# Patient Record
Sex: Female | Born: 1960 | Race: Black or African American | Hispanic: No | Marital: Married | Smoking: Never smoker
Health system: Southern US, Community
[De-identification: ages and names within clinical notes are randomized; demographics above are authoritative.]

## PROBLEM LIST (undated history)

## (undated) DIAGNOSIS — D219 Benign neoplasm of connective and other soft tissue, unspecified: Secondary | ICD-10-CM

## (undated) DIAGNOSIS — R7303 Prediabetes: Secondary | ICD-10-CM

## (undated) DIAGNOSIS — M5430 Sciatica, unspecified side: Secondary | ICD-10-CM

## (undated) DIAGNOSIS — E669 Obesity, unspecified: Secondary | ICD-10-CM

## (undated) DIAGNOSIS — M2550 Pain in unspecified joint: Secondary | ICD-10-CM

## (undated) DIAGNOSIS — E559 Vitamin D deficiency, unspecified: Secondary | ICD-10-CM

## (undated) DIAGNOSIS — K219 Gastro-esophageal reflux disease without esophagitis: Secondary | ICD-10-CM

## (undated) DIAGNOSIS — M25559 Pain in unspecified hip: Secondary | ICD-10-CM

## (undated) DIAGNOSIS — D649 Anemia, unspecified: Secondary | ICD-10-CM

## (undated) DIAGNOSIS — I10 Essential (primary) hypertension: Secondary | ICD-10-CM

## (undated) DIAGNOSIS — R600 Localized edema: Secondary | ICD-10-CM

## (undated) DIAGNOSIS — G4730 Sleep apnea, unspecified: Secondary | ICD-10-CM

## (undated) DIAGNOSIS — M549 Dorsalgia, unspecified: Secondary | ICD-10-CM

## (undated) HISTORY — DX: Obesity, unspecified: E66.9

## (undated) HISTORY — DX: Essential (primary) hypertension: I10

## (undated) HISTORY — DX: Localized edema: R60.0

## (undated) HISTORY — DX: Benign neoplasm of connective and other soft tissue, unspecified: D21.9

## (undated) HISTORY — DX: Vitamin D deficiency, unspecified: E55.9

## (undated) HISTORY — DX: Dorsalgia, unspecified: M54.9

## (undated) HISTORY — DX: Sciatica, unspecified side: M54.30

## (undated) HISTORY — PX: CARPAL TUNNEL RELEASE: SHX101

## (undated) HISTORY — PX: OTHER SURGICAL HISTORY: SHX169

## (undated) HISTORY — DX: Gastro-esophageal reflux disease without esophagitis: K21.9

## (undated) HISTORY — DX: Pain in unspecified joint: M25.50

## (undated) HISTORY — DX: Pain in unspecified hip: M25.559

## (undated) HISTORY — PX: MYOMECTOMY ABDOMINAL APPROACH: SUR870

## (undated) HISTORY — PX: MYOMECTOMY VAGINAL APPROACH: SUR871

## (undated) HISTORY — DX: Anemia, unspecified: D64.9

## (undated) HISTORY — DX: Prediabetes: R73.03

## (undated) HISTORY — PX: BREAST EXCISIONAL BIOPSY: SUR124

## (undated) HISTORY — DX: Sleep apnea, unspecified: G47.30

---

## 1998-12-14 ENCOUNTER — Other Ambulatory Visit: Admission: RE | Admit: 1998-12-14 | Discharge: 1998-12-14 | Payer: Self-pay | Admitting: Obstetrics and Gynecology

## 1999-01-15 ENCOUNTER — Inpatient Hospital Stay (HOSPITAL_COMMUNITY): Admission: AD | Admit: 1999-01-15 | Discharge: 1999-01-15 | Payer: Self-pay | Admitting: Obstetrics and Gynecology

## 1999-09-06 ENCOUNTER — Other Ambulatory Visit: Admission: RE | Admit: 1999-09-06 | Discharge: 1999-09-06 | Payer: Self-pay | Admitting: Urology

## 1999-11-20 ENCOUNTER — Encounter (INDEPENDENT_AMBULATORY_CARE_PROVIDER_SITE_OTHER): Payer: Self-pay | Admitting: Specialist

## 1999-11-20 ENCOUNTER — Ambulatory Visit (HOSPITAL_COMMUNITY): Admission: RE | Admit: 1999-11-20 | Discharge: 1999-11-20 | Payer: Self-pay | Admitting: Obstetrics and Gynecology

## 2000-03-06 ENCOUNTER — Encounter: Payer: Self-pay | Admitting: Obstetrics and Gynecology

## 2000-03-06 ENCOUNTER — Encounter: Admission: RE | Admit: 2000-03-06 | Discharge: 2000-03-06 | Payer: Self-pay | Admitting: Obstetrics and Gynecology

## 2000-11-07 ENCOUNTER — Other Ambulatory Visit: Admission: RE | Admit: 2000-11-07 | Discharge: 2000-11-07 | Payer: Self-pay | Admitting: Obstetrics and Gynecology

## 2001-04-08 ENCOUNTER — Encounter: Payer: Self-pay | Admitting: Obstetrics and Gynecology

## 2001-04-08 ENCOUNTER — Encounter: Admission: RE | Admit: 2001-04-08 | Discharge: 2001-04-08 | Payer: Self-pay | Admitting: Obstetrics and Gynecology

## 2001-04-10 ENCOUNTER — Encounter: Payer: Self-pay | Admitting: Obstetrics and Gynecology

## 2001-04-10 ENCOUNTER — Encounter: Admission: RE | Admit: 2001-04-10 | Discharge: 2001-04-10 | Payer: Self-pay | Admitting: Obstetrics and Gynecology

## 2001-04-14 ENCOUNTER — Encounter: Admission: RE | Admit: 2001-04-14 | Discharge: 2001-04-14 | Payer: Self-pay | Admitting: Obstetrics and Gynecology

## 2001-04-14 ENCOUNTER — Encounter: Payer: Self-pay | Admitting: Obstetrics and Gynecology

## 2001-08-05 DIAGNOSIS — D249 Benign neoplasm of unspecified breast: Secondary | ICD-10-CM | POA: Insufficient documentation

## 2001-09-30 ENCOUNTER — Other Ambulatory Visit: Admission: RE | Admit: 2001-09-30 | Discharge: 2001-09-30 | Payer: Self-pay | Admitting: Obstetrics and Gynecology

## 2001-10-07 ENCOUNTER — Encounter: Payer: Self-pay | Admitting: Obstetrics and Gynecology

## 2001-10-07 ENCOUNTER — Encounter: Admission: RE | Admit: 2001-10-07 | Discharge: 2001-10-07 | Payer: Self-pay | Admitting: Obstetrics and Gynecology

## 2002-10-26 ENCOUNTER — Other Ambulatory Visit: Admission: RE | Admit: 2002-10-26 | Discharge: 2002-10-26 | Payer: Self-pay | Admitting: Obstetrics and Gynecology

## 2002-10-28 ENCOUNTER — Encounter: Admission: RE | Admit: 2002-10-28 | Discharge: 2002-10-28 | Payer: Self-pay | Admitting: Obstetrics and Gynecology

## 2002-10-28 ENCOUNTER — Encounter: Payer: Self-pay | Admitting: Obstetrics and Gynecology

## 2004-01-03 ENCOUNTER — Encounter: Admission: RE | Admit: 2004-01-03 | Discharge: 2004-01-03 | Payer: Self-pay | Admitting: Obstetrics and Gynecology

## 2005-04-02 ENCOUNTER — Encounter: Admission: RE | Admit: 2005-04-02 | Discharge: 2005-04-02 | Payer: Self-pay | Admitting: Obstetrics and Gynecology

## 2005-04-15 ENCOUNTER — Encounter: Admission: RE | Admit: 2005-04-15 | Discharge: 2005-04-15 | Payer: Self-pay | Admitting: Obstetrics and Gynecology

## 2005-06-03 ENCOUNTER — Encounter: Admission: RE | Admit: 2005-06-03 | Discharge: 2005-06-03 | Payer: Self-pay | Admitting: General Surgery

## 2005-06-03 ENCOUNTER — Ambulatory Visit (HOSPITAL_COMMUNITY): Admission: RE | Admit: 2005-06-03 | Discharge: 2005-06-03 | Payer: Self-pay | Admitting: General Surgery

## 2005-06-03 ENCOUNTER — Encounter (INDEPENDENT_AMBULATORY_CARE_PROVIDER_SITE_OTHER): Payer: Self-pay | Admitting: *Deleted

## 2005-06-03 ENCOUNTER — Ambulatory Visit (HOSPITAL_BASED_OUTPATIENT_CLINIC_OR_DEPARTMENT_OTHER): Admission: RE | Admit: 2005-06-03 | Discharge: 2005-06-03 | Payer: Self-pay | Admitting: General Surgery

## 2005-07-04 ENCOUNTER — Other Ambulatory Visit: Admission: RE | Admit: 2005-07-04 | Discharge: 2005-07-04 | Payer: Self-pay | Admitting: Gynecology

## 2006-04-08 ENCOUNTER — Encounter: Admission: RE | Admit: 2006-04-08 | Discharge: 2006-04-08 | Payer: Self-pay | Admitting: Gynecology

## 2008-10-10 ENCOUNTER — Encounter: Admission: RE | Admit: 2008-10-10 | Discharge: 2008-10-10 | Payer: Self-pay | Admitting: Obstetrics and Gynecology

## 2009-07-12 ENCOUNTER — Encounter: Admission: RE | Admit: 2009-07-12 | Discharge: 2009-08-02 | Payer: Self-pay | Admitting: Internal Medicine

## 2009-10-27 ENCOUNTER — Encounter: Admission: RE | Admit: 2009-10-27 | Discharge: 2009-10-27 | Payer: Self-pay | Admitting: Obstetrics and Gynecology

## 2010-08-05 DIAGNOSIS — K635 Polyp of colon: Secondary | ICD-10-CM | POA: Insufficient documentation

## 2010-12-21 NOTE — H&P (Signed)
[place]  Patient:    Whitney Foster, Whitney Foster                      MRN: [id number] Attending:  Maris Berger. Pennie Rushing, M.D.                         History and Physical  DATE OF BIRTH:  2061-07-21.  HISTORY OF PRESENT ILLNESS:  The patient is a 50 year old married black female,  PI/0/0/I, who presents for evaluation of back pain with a known diagnosis of uterine fibroids.  The patient had her last menstrual period on 09/24/99.  Menses have been irregular, though no intermenstrual bleeding.  She is currently not using any contraception. Review of the patients history reveals that she underwent an abdominal myomectomy in 1994 and has had recurrence of her symptoms.  She subsequently underwent a hysteroscopic myomectomy in 1997, and had a pregnancy delivered by cesarean section in 1998.  She presented on 10/18/99 for an opinion concerning management and evaluation of back pain, which had been present since November.  Her work-up to  that date had included back x-rays by her primary care physician, which were within normal limits, and a complete urologic work-up, which was found to be within normal limits.  That included an intravenous pyelogram.  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  At that time, the recommendation was made that she undergo a hysterectomy and she presents for further suggestions concerning her management.  PAST HISTORY:  Menarche at age 66, with fairly regular monthly menses up until November, when her menses became somewhat irregular.  She denies any menopausal  symptoms.  She would describe her menses as heavy flow, usually lasting seven to ten days.  She denies any intermenstrual bleeding, and is currently using no contraception, though she does desire  permanent surgical sterilization.  She has used birth control pills in the past.  She is currently sexually active in a monogamous relationship.  Her last Pap smear was May, 2000, and was normal. She has no history of abnormal Pap smears.  She has a diagnosis of fibroids, which ave been present for approximately ten years.  Her last mammogram was in June, 2000, and was normal.  Medical history is essentially negative.  SURGICAL HISTORY:  Hysteroscopic myomectomy.  Cesarean section.  Abdominal myomectomy.  Removal of vocal cord nodules.  CURRENT MEDICATIONS:  None.  DRUG ALLERGIES:  None.   FAMILY HISTORY:  Positive for heart disease, varicose veins, thyroid disease, cancer, hypertension, diabetes and stroke.  REVIEW OF SYSTEMS:  Positive for back pain, which is present on an almost daily  basis.  She has no associated leg numbness or inability to walk.  She does use ibuprofen with some improvement in her pain.  She has worsening of back pain with her menses.  PHYSICAL EXAMINATION:  VITAL SIGNS:  Blood pressure 110/88.  LUNGS:  Clear.  HEART:  Regular rate and rhythm.  ABDOMEN:  Soft, without masses or organomegaly.  PELVIC:  ______ within normal limits.  Vagina is ______.  Cervix without gross lesions.  The uterus feels upper limits of normal size and anterior, mobile; there is tenderness to mobility of the uterus with no posterior component to the uterus and no posterior pain can  be elicited on examination or on rectovaginal exam.  IMPRESSION: 1. Known uterine fibroids. 2. Back pain of recent onset; question relationship to uterine fibroids.  DISPOSITION:  A long discussion has been held with the patient concerning options for further evaluation of her pain.  She is offered diagnostic laparoscopy to determine whether other pelvic etiology, such as adenomyosis or endometriosis might be contributing to her back pain, or whether no other gynecologic diagnoses  can be found.  She wishes to undergo diagnostic laparoscopy and at the same time, surgical sterilization.  She understands the permanence of surgical sterilization as well as the risks of subsequent pregnancy after sterilization.  She likewise understands the risks of anesthesia, bleeding, infection and damage to adjacent organs, but wishes to proceed with diagnostic laparoscopy and laparoscopic tubal cautery at [facility] on 11/20/99. DD:  11/19/99 TD:  11/19/99 Job: [id number]

## 2010-12-21 NOTE — Op Note (Signed)
[facility]  Patient:    Whitney Foster, Whitney Foster                    MRN: [id number] Proc. Date: 11/20/99 Adm. Date:  [id number] Attending:  Dierdre Forth Pearline                           Operative Report  PREOPERATIVE DIAGNOSIS:       Uterine fibroids, increasingly severe back pain.  POSTOPERATIVE DIAGNOSIS:      Uterine fibroids, rule out adenomyosis, rule out endometriosis.  OPERATION:                    Diagnostic laparoscopy, biopsies of the left posterior cul-de-sac and the left anterior uterus.  SURGEON:                      Vanessa P. Haygood, M.D.  ASSISTANT:  FINDINGS:                     The uterus was upper limits of normal size with an apparently mushy consistency.  There were several small uterine fibroids, the largest of which measured 2 x 2 cm and was on the posterior left uterus near the cervix.  There was a smaller fibroid on the right posterior uterus near the right uterine artery and an anterior fundal fibroid.  There was an overall apparently  soft consistency to the uterus as noted with manipulation with the blunt probe.  Anteriorly, there was evidence of a previous cesarean section with minimal scarring on the anterior cervix and the anterior peritoneum.  The ovaries were normal bilaterally.  The tubes were normal bilaterally.  There was a 2 cm portion of omentum adherent to the right cornual region with no attachment to other omental segment.  The appendix appeared normal as did the gallbladder and liver edge.  ANESTHESIA:  ESTIMATED BLOOD LOSS:  DESCRIPTION OF PROCEDURE:     After appropriate identification, a long discussion was held with the patient and her husband concerning an initially expressed desire for tubal sterilization.  After that 15-minute discussion, it was clear that the couple had not yet made up their minds for surgical sterilization and in fact had not clearly made the decision that they  wanted no further children.  It was then decided that the tubal cautery would be omitted from the operative procedure.  The patient was taken to the operating room and placed on the operating table.  After the attainment of adequate general anesthesia, she was placed in the modified lithotomy position.  The abdomen, perineum, and vagina were prepped with multiple layers of Betadine and a Foley catheter inserted into the bladder under sterile  conditions and connected to straight drainage.  A single tooth tenaculum was placed on the anterior cervix.  The abdomen was draped as a sterile field. Subumbilical infiltration of 0.25% Marcaine for a total of 4 cc and then a total of 3 cc to he right and left of midline in the suprapubic region was undertaken.  A subumbilical incisions was made and the Veress cannula placed through that incision into the  peritoneal cavity.  A pneumoperitoneum was created with 3.2 liters of CO2.  The  Veress cannula was removed and the laparoscopic trocar placed through that incision into the peritoneal cavity.  A laparoscope was placed through the trocar sleeve and a left suprapubic incision made  and the laparoscopic probe trocar placed through that incision under direct visualization.  The above noted findings were made in addition to an area of peritoneal defect underneath the left uterosacral ligament. This area was biopsied to rule out endometriosis.  A biopsy of the anterior left uterus was then obtained to rule out adenomyosis and hemostasis achieved with bipolar cautery.  Copious irrigation was carried out and hemostasis noted to be  adequate.  Approximately 60 cc of warm Ringers lactate was left in the peritoneal cavity.  All instruments were removed from the peritoneal cavity under direct visualization as the CO2 was allowed to escape.  The subumbilical incision and suprapubic incision were closed with subcuticular sutures of 3-0 Vicryl.   A sterile dressing was applied to the subumbilical incision and Steri-Strips appeared to he suprapubic incision.  The single tooth tenaculum and Foley catheter were removed and the patient was awakened from general anesthesia and taken to the recovery oom in satisfactory condition having tolerated the procedure well with sponge, needle, and instrument counts were correct. DD:  11/20/99 TD:  11/20/99 Job: [id number]

## 2010-12-21 NOTE — Op Note (Signed)
Whitney Foster, Whitney Foster             ACCOUNT NO.:  [id number]   MEDICAL RECORD NO.:  [id number]          PATIENT TYPE:  AMB   LOCATION:  DSC                          FACILITY:  [facility]   PHYSICIAN:  Adolph Pollack, M.D.DATE OF BIRTH:  [birth year]   DATE OF PROCEDURE:  06/03/2005  DATE OF DISCHARGE:                                 OPERATIVE REPORT   PREOP DIAGNOSIS:  Nonpalpable left breast lesion.   POSTOPERATIVE DIAGNOSIS:  Nonpalpable left breast lesion.   PROCEDURE:  Left breast biopsy after needle localization.   SURGEON:  Adolph Pollack, M.D.   ANESTHESIA:  General plus plain Marcaine local.   INDICATIONS:  This 50 year old female has had a nonpalpable left breast mass  that had been stable; however, it did change in size this year and biopsy  was recommended. She had a successful needle localization and now presents  for the above procedure. We have discussed the procedure and the risks  preoperatively.   She is seen in the holding area and the left breast marked with my initials.  She then brought to the operating room, placed supine on the operating  table. General anesthetic was induced. Tape was removed from the wire and  part of the wire was cut off. The left breast and remaining wire were  sterilely prepped and draped. Local anesthetic was infiltrated in the  lateral aspect of the left breast superficially and deep and a curvilinear  incision was made in the left breast with a small skin plug excised around  the wire. I then used sharp dissection and excised a cylinder shaped sample  of breast tissue all around the wire and removed this. I could see the tip  of the wire after the excision. I then excised more tissue medially to be a  final medial margin. The curved end of the wire anteriorly was marked with a  silk suture. The specimen mammography was performed and the area was  contained in the specimen.   Following this, the wound was inspected and hemostasis  was obtained using  electrocautery. Once hemostasis was adequate, the subcutaneous tissue was  approximated with interrupted 3-0 Vicryl sutures and skin closed with a  running 4-0 Monocryl subcuticular stitch. Steri-Strips and sterile dressing  were applied.   She tolerated the procedure well without apparent complications and was  taken to the recovery room in satisfactory condition. She will be given  discharge instructions and Vicodin for pain and follow-up in the office with  me in a week or two.      Adolph Pollack, M.D.  Electronically Signed     TJR/MEDQ  D:  06/03/2005  T:  06/03/2005  Job:  [id number]   cc:   Daryl Eastern, M.D.  Fax: [phone]   Candyce Churn. Allyne Gee, M.D.  Fax: [phone]

## 2011-08-22 ENCOUNTER — Encounter: Payer: Self-pay | Admitting: Cardiology

## 2011-10-18 ENCOUNTER — Other Ambulatory Visit: Payer: Self-pay | Admitting: Obstetrics and Gynecology

## 2011-10-18 ENCOUNTER — Other Ambulatory Visit: Payer: Self-pay | Admitting: Internal Medicine

## 2011-10-18 DIAGNOSIS — Z1231 Encounter for screening mammogram for malignant neoplasm of breast: Secondary | ICD-10-CM

## 2011-10-28 ENCOUNTER — Ambulatory Visit: Payer: Self-pay

## 2011-10-28 ENCOUNTER — Ambulatory Visit
Admission: RE | Admit: 2011-10-28 | Discharge: 2011-10-28 | Disposition: A | Discharge status: Home / Self Care | Payer: 59 | Source: Ambulatory Visit | Attending: Internal Medicine | Admitting: Internal Medicine

## 2011-10-28 DIAGNOSIS — Z1231 Encounter for screening mammogram for malignant neoplasm of breast: Secondary | ICD-10-CM

## 2011-10-31 ENCOUNTER — Other Ambulatory Visit: Payer: Self-pay | Admitting: Internal Medicine

## 2011-10-31 DIAGNOSIS — R928 Other abnormal and inconclusive findings on diagnostic imaging of breast: Secondary | ICD-10-CM

## 2011-11-01 ENCOUNTER — Ambulatory Visit
Admission: RE | Admit: 2011-11-01 | Discharge: 2011-11-01 | Disposition: A | Discharge status: Home / Self Care | Payer: 59 | Source: Ambulatory Visit | Attending: Internal Medicine | Admitting: Internal Medicine

## 2011-11-01 ENCOUNTER — Ambulatory Visit: Payer: Self-pay

## 2011-11-01 DIAGNOSIS — R928 Other abnormal and inconclusive findings on diagnostic imaging of breast: Secondary | ICD-10-CM

## 2011-11-13 ENCOUNTER — Encounter: Payer: Self-pay | Admitting: Obstetrics and Gynecology

## 2011-11-13 ENCOUNTER — Ambulatory Visit (INDEPENDENT_AMBULATORY_CARE_PROVIDER_SITE_OTHER): Payer: 59 | Admitting: Obstetrics and Gynecology

## 2011-11-13 DIAGNOSIS — D259 Leiomyoma of uterus, unspecified: Secondary | ICD-10-CM

## 2011-11-13 DIAGNOSIS — N949 Unspecified condition associated with female genital organs and menstrual cycle: Secondary | ICD-10-CM

## 2011-11-13 DIAGNOSIS — R109 Unspecified abdominal pain: Secondary | ICD-10-CM | POA: Insufficient documentation

## 2011-11-13 DIAGNOSIS — R102 Pelvic and perineal pain: Secondary | ICD-10-CM

## 2011-11-13 DIAGNOSIS — D219 Benign neoplasm of connective and other soft tissue, unspecified: Secondary | ICD-10-CM

## 2011-11-13 LAB — POCT URINALYSIS DIPSTICK
Ketones, UA: NEGATIVE
Leukocytes, UA: NEGATIVE
Nitrite, UA: NEGATIVE
Urobilinogen, UA: NEGATIVE

## 2011-11-15 LAB — URINE CULTURE: Colony Count: 8000

## 2011-11-17 ENCOUNTER — Encounter: Payer: Self-pay | Admitting: Obstetrics and Gynecology

## 2011-11-17 NOTE — Progress Notes (Signed)
Subjective: Patient is a 51 y.o. gravida 2 para 1, female.  Presents for evaluation of abdominal/pelvic pain. Onset of symptoms was gradual starting over a year ago with gradually worsening course since that time. The pain occurs all throughout the month. It is located in the RLQ and radiates into the lower pelvis and lasts a few hours. She describes the pain as dull, aching and intermittent. Symptoms improve with time. In the past, she has undergone medical management, including NSAIDS.  She has no history of PID, STD's. She does not desire further childbearing.  Pertinent Gyn History: [place] fibroids Menses: flow is moderate, regular every month without intermenstrual spotting and usually lasting 2 to 3 days Bleeding: none outside menses Contraception: none DES exposure: unknown Blood transfusions: none STDs: no past history Preventive screening:  Last mammogram: normal Date: 2011 Last pap: normal Date: 2010  Patient Active Problem List  Diagnoses Date Noted  . Fibroid 11/13/2011  . Abdominal pain 11/13/2011  . Pelvic pain 11/13/2011   Past Medical History  Diagnosis Date  . Hypertension   . Anemia   . Fibroids     "2000"    Past Surgical History  Procedure Date  . Cesarean section     "1998"  . Myomectomy abdominal approach     1994  . Myomectomy vaginal approach     1997     (Not in a hospital admission) No Known Allergies  History  Substance Use Topics  . Smoking status: Never Smoker   . Smokeless tobacco: Not on file  . Alcohol Use: No    Family History  Problem Relation Age of Onset  . Heart disease Mother 72  . Heart disease Brother   . Heart disease Maternal Aunt     Review of Systems Gastrointestinal: positive for abdominal pain, negative for change in bowel habits, constipation, diarrhea, dyspepsia, dysphagia, nausea, vomiting and pain sometimes prevents sleep. Genitourinary:positive for rare dysuria, negative for , dysuria, frequency, hematuria and  urinary incontinence   Objective: Vital signs in last 24 hours: @VSRANGES @   NWG:NFAOZHY guarding, without rebound, no significant tenderness to deep palpation.   PELVIC:  normal external genitalia, vulva, vagina, , exam chaperoned by female assistant, normal vagina and vulva, EGBUS within normal limits, pelvic exam is limited due to obesity, normal cervix without lesions, polyps or tenderness, uterus abnormal, enlarged.  ,  Pap smear to be scheduled by patient for future completion,     Data Review: as above   Assessment/Plan: Long standing pelvic and abdominal pain without GI symptoms. Known history of fibroids, s/p abd myomectomy and hysteroscopic myomectomy  PLAN: Urine C&S Ultrasound with followup visit

## 2011-11-20 ENCOUNTER — Ambulatory Visit (INDEPENDENT_AMBULATORY_CARE_PROVIDER_SITE_OTHER): Payer: 59

## 2011-11-20 ENCOUNTER — Ambulatory Visit (INDEPENDENT_AMBULATORY_CARE_PROVIDER_SITE_OTHER): Payer: 59 | Admitting: Obstetrics and Gynecology

## 2011-11-20 VITALS — BP 136/80 | Temp 97.9°F | Wt 208.0 lb

## 2011-11-20 DIAGNOSIS — D259 Leiomyoma of uterus, unspecified: Secondary | ICD-10-CM

## 2011-11-20 DIAGNOSIS — D219 Benign neoplasm of connective and other soft tissue, unspecified: Secondary | ICD-10-CM

## 2011-11-20 DIAGNOSIS — R109 Unspecified abdominal pain: Secondary | ICD-10-CM

## 2011-11-20 DIAGNOSIS — R102 Pelvic and perineal pain: Secondary | ICD-10-CM

## 2011-11-20 DIAGNOSIS — N949 Unspecified condition associated with female genital organs and menstrual cycle: Secondary | ICD-10-CM

## 2011-11-20 NOTE — Progress Notes (Signed)
Pt with episodes of pelvic pain, history of fibroids for [name] to r/o adnexal disease  Uterus  Length:  9.92 cm Em thickness:  12.67mm Ovaries: nl Fibroids: 5 measurable from 1.8 cm to 3.23cm in lgst diameter  Impression:  Pelvic pain without clear gyn etiology                      Known fibroids                      [place] r/o endometriosis Plan:  Discussion held with patient offering diagnostic laparoscopy as next step in evaluation of pain.  CT mentioned, but in absence of GI of GU sx, may be low yield.  Risks and benefits of surgery reviewed.  Possibility of adhesions from prior abdominal surgical procedures reviewed.  Pt will consider info and call for desired next step of f/u at [facility].

## 2011-11-20 NOTE — Patient Instructions (Signed)

## 2011-11-21 ENCOUNTER — Encounter: Payer: Self-pay | Admitting: Obstetrics and Gynecology

## 2011-12-21 ENCOUNTER — Encounter (HOSPITAL_COMMUNITY): Payer: Self-pay | Admitting: *Deleted

## 2011-12-21 ENCOUNTER — Emergency Department (HOSPITAL_COMMUNITY)
Admission: EM | Admit: 2011-12-21 | Discharge: 2011-12-21 | Disposition: A | Discharge status: Home / Self Care | Payer: No Typology Code available for payment source | Attending: Emergency Medicine | Admitting: Emergency Medicine

## 2011-12-21 DIAGNOSIS — M25519 Pain in unspecified shoulder: Secondary | ICD-10-CM | POA: Diagnosis present

## 2011-12-21 DIAGNOSIS — T148XXA Other injury of unspecified body region, initial encounter: Secondary | ICD-10-CM | POA: Diagnosis not present

## 2011-12-21 DIAGNOSIS — I10 Essential (primary) hypertension: Secondary | ICD-10-CM | POA: Insufficient documentation

## 2011-12-21 MED ORDER — METHOCARBAMOL 500 MG PO TABS: 1000.0000 mg | ORAL_TABLET | Freq: Four times a day (QID) | ORAL | Status: AC

## 2011-12-21 MED ORDER — IBUPROFEN 800 MG PO TABS
800.0000 mg | ORAL_TABLET | Freq: Once | ORAL | Status: AC
  Administered 2011-12-21: 800 mg via ORAL
  Filled 2011-12-21: qty 1

## 2011-12-21 MED ORDER — IBUPROFEN 800 MG PO TABS: 800.0000 mg | ORAL_TABLET | Freq: Three times a day (TID) | ORAL | Status: AC | PRN
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
## 2011-12-21 NOTE — ED Notes (Signed)
PA at bedside.
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
## 2011-12-24 NOTE — ED Provider Notes (Signed)
Medical screening examination/treatment/procedure(s) were performed by non-physician practitioner and as supervising physician I was immediately available for consultation/collaboration.   Joya Gaskins, MD 12/24/11 [phone]

## 2012-06-23 ENCOUNTER — Telehealth: Payer: Self-pay | Admitting: Obstetrics and Gynecology

## 2012-06-23 NOTE — Telephone Encounter (Signed)
See pt msg

## 2012-06-24 ENCOUNTER — Encounter: Payer: 59 | Admitting: Obstetrics and Gynecology

## 2012-06-25 NOTE — Telephone Encounter (Signed)
Tc to pt. Pt wants to sched an appt due to abd pain. Wants fibroids re-evaluated. Appt sched 07/01/12 @ 3:30p with [facility]. Pt agrees

## 2012-07-01 ENCOUNTER — Ambulatory Visit (INDEPENDENT_AMBULATORY_CARE_PROVIDER_SITE_OTHER): Payer: 59 | Admitting: Obstetrics and Gynecology

## 2012-07-01 ENCOUNTER — Encounter: Payer: Self-pay | Admitting: Obstetrics and Gynecology

## 2012-07-01 VITALS — BP 134/78 | Temp 98.1°F | Wt 218.0 lb

## 2012-07-01 DIAGNOSIS — N949 Unspecified condition associated with female genital organs and menstrual cycle: Secondary | ICD-10-CM

## 2012-07-01 DIAGNOSIS — R102 Pelvic and perineal pain: Secondary | ICD-10-CM

## 2012-07-01 DIAGNOSIS — R109 Unspecified abdominal pain: Secondary | ICD-10-CM

## 2012-07-01 DIAGNOSIS — D259 Leiomyoma of uterus, unspecified: Secondary | ICD-10-CM

## 2012-07-01 DIAGNOSIS — D219 Benign neoplasm of connective and other soft tissue, unspecified: Secondary | ICD-10-CM

## 2012-07-01 LAB — POCT URINALYSIS DIPSTICK
Bilirubin, UA: NEGATIVE
Glucose, UA: NEGATIVE
Spec Grav, UA: 1.015
pH, UA: 6

## 2012-07-01 MED ORDER — MEDROXYPROGESTERONE ACETATE 10 MG PO TABS: ORAL_TABLET | ORAL | Status: DC

## 2012-07-01 NOTE — Progress Notes (Signed)
GYN PROBLEM VISIT  [name] is a 51 y.o. year old female,G2P1011, who presents for a problem visit. Menses q28 days for 3 days.  1 wk prior starts with abdominal pain, and can continue throughout cycle.  Taking tylenol or ibuprofen with relief.  Subjective: Vag. Discharge:no Odor:no Fever:no Irreg.Periods:no Dyspareunia:no Dysuria:no Frequency:yes Urgency:no Hematuria:no Kidney stones:no Constipation:no Diarrhea:no Rectal Bleeding: no Vomiting:no Nausea:no Pregnant:no Fibroids:yes Endometriosis:no Hx of Ovarian Cyst:no Hx IUD:no Hx STD-PID:no Appendectomy:yes Gall Bladder Dz:no   Objective:  There were no vitals taken for this visit.   GI: soft, non-tender; bowel sounds normal; no masses,  no organomegaly  External genitalia: normal general appearance Vaginal: normal rugae Cervix: normal appearance and  No cervical motion tenderness Adnexa: no masses Uterus: doesn't feel enlarged.  Tenderness to palpation posterior to the cervix Rectovaginal: no masses  Assessment: Known fibroids Pelvic pain and dysmenorrhea Differential includes endometriosis and adenomyosis  Plan: Options: Laparoscopy   Provera   Mirena   Lupron   Hysterectomy Pt agrees to Provera 40mg  daily Return to office in 6 week(s).   Whitney Forth, MD  07/01/2012 3:28 PM

## 2012-08-13 ENCOUNTER — Encounter: Payer: 59 | Admitting: Obstetrics and Gynecology

## 2012-09-08 ENCOUNTER — Other Ambulatory Visit: Payer: Self-pay | Admitting: Gastroenterology

## 2012-09-08 DIAGNOSIS — R1310 Dysphagia, unspecified: Secondary | ICD-10-CM

## 2012-09-21 ENCOUNTER — Ambulatory Visit
Admission: RE | Admit: 2012-09-21 | Discharge: 2012-09-21 | Disposition: A | Discharge status: Home / Self Care | Payer: 59 | Source: Ambulatory Visit | Attending: Gastroenterology | Admitting: Gastroenterology

## 2012-09-21 DIAGNOSIS — R1310 Dysphagia, unspecified: Secondary | ICD-10-CM

## 2012-10-08 ENCOUNTER — Encounter (HOSPITAL_COMMUNITY): Payer: Self-pay | Admitting: Emergency Medicine

## 2012-10-08 ENCOUNTER — Observation Stay (HOSPITAL_COMMUNITY)
Admission: EM | Admit: 2012-10-08 | Discharge: 2012-10-09 | Disposition: A | Discharge status: Home / Self Care | Payer: 59 | Attending: Emergency Medicine | Admitting: Emergency Medicine

## 2012-10-08 ENCOUNTER — Emergency Department (HOSPITAL_COMMUNITY): Payer: 59

## 2012-10-08 DIAGNOSIS — R42 Dizziness and giddiness: Secondary | ICD-10-CM | POA: Insufficient documentation

## 2012-10-08 DIAGNOSIS — D500 Iron deficiency anemia secondary to blood loss (chronic): Principal | ICD-10-CM | POA: Insufficient documentation

## 2012-10-08 DIAGNOSIS — R030 Elevated blood-pressure reading, without diagnosis of hypertension: Secondary | ICD-10-CM | POA: Insufficient documentation

## 2012-10-08 DIAGNOSIS — R5381 Other malaise: Secondary | ICD-10-CM | POA: Insufficient documentation

## 2012-10-08 DIAGNOSIS — R102 Pelvic and perineal pain: Secondary | ICD-10-CM

## 2012-10-08 DIAGNOSIS — IMO0001 Reserved for inherently not codable concepts without codable children: Secondary | ICD-10-CM

## 2012-10-08 DIAGNOSIS — D219 Benign neoplasm of connective and other soft tissue, unspecified: Secondary | ICD-10-CM

## 2012-10-08 DIAGNOSIS — R5383 Other fatigue: Secondary | ICD-10-CM | POA: Insufficient documentation

## 2012-10-08 DIAGNOSIS — I10 Essential (primary) hypertension: Secondary | ICD-10-CM | POA: Diagnosis present

## 2012-10-08 DIAGNOSIS — Z79899 Other long term (current) drug therapy: Secondary | ICD-10-CM | POA: Insufficient documentation

## 2012-10-08 DIAGNOSIS — D259 Leiomyoma of uterus, unspecified: Secondary | ICD-10-CM | POA: Insufficient documentation

## 2012-10-08 DIAGNOSIS — N920 Excessive and frequent menstruation with regular cycle: Secondary | ICD-10-CM | POA: Insufficient documentation

## 2012-10-08 DIAGNOSIS — R609 Edema, unspecified: Secondary | ICD-10-CM | POA: Insufficient documentation

## 2012-10-08 DIAGNOSIS — D649 Anemia, unspecified: Secondary | ICD-10-CM

## 2012-10-08 LAB — COMPREHENSIVE METABOLIC PANEL
Albumin: 3.2 g/dL — ABNORMAL LOW (ref 3.5–5.2)
Alkaline Phosphatase: 76 U/L (ref 39–117)
BUN: 8 mg/dL (ref 6–23)
Chloride: 107 mEq/L (ref 96–112)
Creatinine, Ser: 0.74 mg/dL (ref 0.50–1.10)
GFR calc Af Amer: >90 mL/min (ref >90)
GFR calc non Af Amer: >90 mL/min (ref >90)
Glucose, Bld: 89 mg/dL (ref 70–99)
Total Bilirubin: 0.2 mg/dL — ABNORMAL LOW (ref 0.3–1.2)

## 2012-10-08 LAB — PREPARE RBC (CROSSMATCH)

## 2012-10-08 LAB — CBC WITH DIFFERENTIAL/PLATELET
Basophils Absolute: 0 10*3/uL (ref 0.0–0.1)
Basophils Relative: 0 % (ref 0–1)
HCT: 26.1 % — ABNORMAL LOW (ref 36.0–46.0)
Hemoglobin: 7.6 g/dL — ABNORMAL LOW (ref 12.0–15.0)
Lymphs Abs: 2.7 10*3/uL (ref 0.7–4.0)
MCHC: 29.1 g/dL — ABNORMAL LOW (ref 30.0–36.0)
MCV: 64 fL — ABNORMAL LOW (ref 78.0–100.0)
Monocytes Relative: 7 % (ref 3–12)
Neutro Abs: 3.9 10*3/uL (ref 1.7–7.7)
RDW: 19.3 % — ABNORMAL HIGH (ref 11.5–15.5)
WBC: 7.2 10*3/uL (ref 4.0–10.5)

## 2012-10-08 LAB — RETICULOCYTES
Retic Count, Absolute: 57 10*3/uL (ref 19.0–186.0)
Retic Ct Pct: 1.4 % (ref 0.4–3.1)

## 2012-10-08 LAB — PRO B NATRIURETIC PEPTIDE: Pro B Natriuretic peptide (BNP): 14.2 pg/mL (ref 0–125)

## 2012-10-08 LAB — LIPASE, BLOOD: Lipase: 11 U/L (ref 11–59)

## 2012-10-08 LAB — ABO/RH: ABO/RH(D): O POS

## 2012-10-08 MED ORDER — SODIUM CHLORIDE 0.9 % IJ SOLN
3.0000 mL | Freq: Two times a day (BID) | INTRAMUSCULAR | Status: DC
  Administered 2012-10-08 – 2012-10-09 (×2): 3 mL via INTRAVENOUS

## 2012-10-08 MED ORDER — FERROUS SULFATE 325 (65 FE) MG PO TABS
325.0000 mg | ORAL_TABLET | Freq: Three times a day (TID) | ORAL | Status: DC
  Administered 2012-10-09 (×2): 325 mg via ORAL
  Filled 2012-10-08 (×4): qty 1

## 2012-10-08 MED ORDER — SODIUM CHLORIDE 0.9 % IJ SOLN: 3.0000 mL | INTRAMUSCULAR | Status: DC | PRN

## 2012-10-08 MED ORDER — LORATADINE 10 MG PO TABS
10.0000 mg | ORAL_TABLET | Freq: Every day | ORAL | Status: DC
  Filled 2012-10-08 (×2): qty 1

## 2012-10-08 MED ORDER — SODIUM CHLORIDE 0.9 % IV SOLN: 250.0000 mL | INTRAVENOUS | Status: DC | PRN

## 2012-10-08 MED ORDER — ACETAMINOPHEN 325 MG PO TABS: 650.0000 mg | ORAL_TABLET | Freq: Four times a day (QID) | ORAL | Status: DC | PRN

## 2012-10-08 MED ORDER — ACETAMINOPHEN 650 MG RE SUPP: 650.0000 mg | Freq: Four times a day (QID) | RECTAL | Status: DC | PRN

## 2012-10-08 NOTE — ED Notes (Signed)
Report given to Kim, RN.

## 2012-10-08 NOTE — ED Provider Notes (Signed)
History     CSN: [id number]  Arrival date & time 10/08/12  1206   First MD Initiated Contact with Patient 10/08/12 1236      Chief Complaint  Patient presents with  . Dizziness    (Consider location/radiation/quality/duration/timing/severity/associated sxs/prior treatment) HPI The patient presents with lightheadedness.  Symptoms began today, approximately 4 hours prior to my evaluation.  No clear precipitant.  Symptoms were worse when the patient was upright, trying to work.  She denies disequilibrium, or true syncope.  Symptoms improved slightly at rest.  She notes generalized weakness and new dyspnea on exertion that has become apparent over the past weeks to months.  She denies chest pain with these episodes of exertional fatigue and dyspnea. The patient states that she has a history of anemia for which she is supposed to take iron supplements. She also has a history of perimenopausal issues for which she was recently taking estrogen supplements. She does not smoke, does not drink.  She states that she has typical lower extremity edema, and takes hydrochlorothiazide, though for unclear reasons.  Past Medical History  Diagnosis Date  . Hypertension   . Anemia   . Fibroids     "2000"    Past Surgical History  Procedure Laterality Date  . Cesarean section      "1998"  . Myomectomy abdominal approach      1994  . Myomectomy vaginal approach      1997    Family History  Problem Relation Age of Onset  . Heart disease Mother 40  . Heart disease Brother   . Heart disease Maternal Aunt     History  Substance Use Topics  . Smoking status: Never Smoker   . Smokeless tobacco: Never Used  . Alcohol Use: No    OB History   Grav Para Term Preterm Abortions TAB SAB Ect Mult Living   2 1 1  0 1 0 1 0 0 1      Review of Systems  Constitutional:       Per HPI, otherwise negative  HENT:       Per HPI, otherwise negative  Respiratory:       Per HPI, otherwise negative   Cardiovascular:       Per HPI, otherwise negative  Gastrointestinal: Negative for vomiting.  Endocrine:       Negative aside from HPI  Genitourinary:       Neg aside from HPI   Musculoskeletal:       Per HPI, otherwise negative  Skin: Negative.   Neurological: Negative for syncope.    Allergies  Review of patient's allergies indicates no known allergies.  Home Medications   Current Outpatient Rx  Name  Route  Sig  Dispense  Refill  . cetirizine (ZYRTEC) 10 MG tablet   Oral   Take 10 mg by mouth daily.         . hydrochlorothiazide (HYDRODIURIL) 25 MG tablet   Oral   Take 25 mg by mouth daily.         Marland Kitchen ibuprofen (ADVIL,MOTRIN) 200 MG tablet   Oral   Take 200 mg by mouth every 6 (six) hours as needed for pain.         . IRON PO   Oral   Take 1 tablet by mouth daily.            BP 150/104  Pulse 86  Temp(Src) 97.8 F (36.6 C) (Oral)  Resp 16  SpO2 98%  LMP 09/21/2012  Physical Exam  Nursing note and vitals reviewed. Constitutional: She is oriented to person, place, and time. She appears well-developed and well-nourished. No distress.  HENT:  Head: Normocephalic and atraumatic.  Eyes: Conjunctivae and EOM are normal.  Cardiovascular: Normal rate and regular rhythm.   Pulmonary/Chest: Effort normal and breath sounds normal. No stridor. No respiratory distress.  Abdominal: She exhibits no distension.  Genitourinary: Rectal exam shows external hemorrhoid. Rectal exam shows no internal hemorrhoid, no fissure, no mass, no tenderness and anal tone normal. Guaiac negative stool.  Musculoskeletal: She exhibits no edema.  Neurological: She is alert and oriented to person, place, and time. No cranial nerve deficit.  Lower extremity is are mildly edematous, though probably symmetric  Skin: Skin is warm and dry.  Psychiatric: She has a normal mood and affect.    ED Course  Procedures (including critical care time)  Labs Reviewed  CBC WITH DIFFERENTIAL   COMPREHENSIVE METABOLIC PANEL  LIPASE, BLOOD  PRO B NATRIURETIC PEPTIDE  D-DIMER, QUANTITATIVE   No results found.   No diagnosis found.  Pulse ox 98% room air normal  3:13 PM Patient and family members informed of all results.  Cardiac: 75 sr, normal   Date: 10/08/2012  Rate: 76  Rhythm: normal sinus rhythm  QRS Axis: normal  Intervals: normal  ST/T Wave abnormalities: nonspecific T wave changes  Conduction Disutrbances:none  Narrative Interpretation:   Old EKG Reviewed: none available ABNORMAL     MDM  This previously well female, but does acknowledge a prior history of anemia, with no complaints with her iron supplements now presents after a near-syncopal episodes, with increasing fatigue, dyspnea on exertion.  On exam the patient is medically stable, though she is anemic.  Your syncope, she'll be transfused for symptomatic anemia.  I discussed risks and benefits of transfusion prior to the patient's endorsement of this intervention.      Gerhard Munch, MD 10/08/12 [phone]

## 2012-10-08 NOTE — ED Notes (Signed)
Pt complains of "lightheadedness this AM"

## 2012-10-08 NOTE — ED Notes (Signed)
Patient transported to X-ray 

## 2012-10-08 NOTE — ED Notes (Addendum)
Pt states that around 7:30am, the pt felt dizzy, but did not pass out.  Pt sat down and rested for 10 minutes and then went to work.  Lightheadedness continued at work; no headache or N/V/D.  Pt reports feeling weak lately and takes iron pills for anemia. Pt does not report any chest pain.  Recently experiencing some lower back and upper right shoulder that may be related to fibroids per pt.  She takes ibuprofen and Advil as needed.

## 2012-10-08 NOTE — H&P (Signed)
[facility] History and Physical  ISEL SKUFCA WGN:[id number] DOB: [birth year] DOA: 10/08/2012  Referring physician: Dr. Jeraldine Loots PCP: Alva Garnet., MD  Specialists: none  Chief Complaint: weakness and near fainting spell this morning  HPI: Whitney Foster is a 52 y.o. female  With history of fibroids and heavy periods.  Presents to the ED with the above complaints.  She states that this morning while she was fixing herself up she felt weak.  Felt like she was going to faint but did not.  Episode resolved without any intervention other than rest.  As a result patient decided to come to the ED for further evaluation.  The weakness has been present for months reportedly.  Patient states that she has had history of heavy mentrual periods due to fibroids.  Has had 3 different operations for her fibroids but continue to have heavy menstrual cycles.  Also reports that recently she was placed on progesterone but this made her heavy periods worse and prolonged.  This reportedly happened 2 weeks ago.  In the ED patient was found to have a hgb of 7.6 and as a result we were consulted for symptomatic anemia.  Review of Systems: 10 point review of system reviewed with patient and negative unless otherwise mentioned above.  Past Medical History  Diagnosis Date  . Hypertension   . Anemia   . Fibroids     "2000"   Past Surgical History  Procedure Laterality Date  . Cesarean section      "1998"  . Myomectomy abdominal approach      1994  . Myomectomy vaginal approach      1997   Social History:  reports that she has never smoked. She has never used smokeless tobacco. She reports that she does not drink alcohol or use illicit drugs.  where does patient live--home, ALF, SNF? and with whom if at home? Lives at home with husband  Can patient participate in ADLs? yes  No Known Allergies  Family History  Problem Relation Age of Onset  . Heart disease Mother 52  . Heart disease  Brother   . Heart disease Maternal Aunt   none new reported  Prior to Admission medications   Medication Sig Start Date End Date Taking? Authorizing Provider  cetirizine (ZYRTEC) 10 MG tablet Take 10 mg by mouth daily.   Yes Historical Provider, MD  hydrochlorothiazide (HYDRODIURIL) 25 MG tablet Take 25 mg by mouth daily.   Yes Historical Provider, MD  ibuprofen (ADVIL,MOTRIN) 200 MG tablet Take 200 mg by mouth every 6 (six) hours as needed for pain.   Yes Historical Provider, MD  IRON PO Take 1 tablet by mouth daily.    Yes Historical Provider, MD   Physical Exam: Filed Vitals:   10/08/12 1259 10/08/12 1343 10/08/12 1345 10/08/12 1347  BP: 150/104 165/92 173/89 160/107  Pulse:  72 80 85  Temp:      TempSrc:      Resp: 16     SpO2: 98%        General:  Pt in NAD, awake and alert  Eyes: EOMI, non icteric, conjunctival pallor  ENT: no masses on visual examination, moist mucus membranes  Neck: supple, no goiter  Cardiovascular: RRR, No MRG  Respiratory: CTA BL, no wheezes  Abdomen: soft, NT, ND  Skin: warm and dry  Musculoskeletal: no cyanosis or clubbing  Psychiatric: Alert and Oriented x 3  Neurologic: answers questions appropriately, moves all extremities,   Labs on Admission:  Basic Metabolic Panel:  Recent Labs Lab 10/08/12 1335  NA 139  K 3.9  CL 107  CO2 24  GLUCOSE 89  BUN 8  CREATININE 0.74  CALCIUM 8.8   Liver Function Tests:  Recent Labs Lab 10/08/12 1335  AST 11  ALT 8  ALKPHOS 76  BILITOT 0.2*  PROT 6.4  ALBUMIN 3.2*    Recent Labs Lab 10/08/12 1335  LIPASE 11   No results found for this basename: AMMONIA,  in the last 168 hours CBC:  Recent Labs Lab 10/08/12 1335  WBC 7.2  NEUTROABS 3.9  HGB 7.6*  HCT 26.1*  MCV 64.0*  PLT 377   Cardiac Enzymes: No results found for this basename: CKTOTAL, CKMB, CKMBINDEX, TROPONINI,  in the last 168 hours  BNP (last 3 results)  Recent Labs  10/08/12 1335  PROBNP 14.2    CBG: No results found for this basename: GLUCAP,  in the last 168 hours  Radiological Exams on Admission: Dg Chest 2 View  10/08/2012  *RADIOLOGY REPORT*  Clinical Data: Dizziness and lightheadedness.  CHEST - 2 VIEW  Comparison: None.  Findings: Calcified granulomata are seen in the right middle lobe. Lungs otherwise clear.  No pneumothorax or pleural effusion.  Heart size upper normal.  IMPRESSION: No acute abnormality.   Original Report Authenticated By: Holley Dexter, M.D.     EKG: Independently reviewed. Sinus rhythm no St elevation or depressions  Assessment/Plan Active Problems:   1. Anemia - At this point will monitor, no active bleeding currently - Most likely 2ary to heavy and irregular menstrual cycles related to fibroids - Will transfuse 2 units of PRBC's - recheck cbc post transfusion. Nursing to place order for post transfusion cbc per protocol  2.  Elevated blood pressures - patient reports being on hctz but reportedly not for blood pressure but for edema in her lower extremities. - will monitor blood pressures and place on low sodium diet.  If persistently elevated will recommend antihypertensive medication.   3. DVT prophylaxis Ted hose.  Code Status: full Family Communication: spoke with patient and spouse at bedside. Disposition Plan: Pending improvement in condition.   Time spent: > 50 minutes  Penny Pia [facility] Pager [phone]  If 7PM-7AM, please contact night-coverage [email or web address] Password [email or web address] 10/08/2012, 4:00 PM

## 2012-10-09 LAB — FERRITIN: Ferritin: 5 ng/mL — ABNORMAL LOW (ref 10–291)

## 2012-10-09 LAB — TYPE AND SCREEN
Antibody Screen: NEGATIVE
Unit division: 0

## 2012-10-09 LAB — CBC
HCT: 30.4 % — ABNORMAL LOW (ref 36.0–46.0)
MCH: 21 pg — ABNORMAL LOW (ref 26.0–34.0)
MCHC: 31.3 g/dL (ref 30.0–36.0)
MCV: 67.3 fL — ABNORMAL LOW (ref 78.0–100.0)
Platelets: 334 10*3/uL (ref 150–400)
RDW: 22.1 % — ABNORMAL HIGH (ref 11.5–15.5)
WBC: 7.2 10*3/uL (ref 4.0–10.5)

## 2012-10-09 LAB — IRON AND TIBC
Iron: 17 ug/dL — ABNORMAL LOW (ref 42–135)
UIBC: 427 ug/dL — ABNORMAL HIGH (ref 125–400)

## 2012-10-09 MED ORDER — SODIUM CHLORIDE 0.9 % IV SOLN
1020.0000 mg | Freq: Once | INTRAVENOUS | Status: AC
  Administered 2012-10-09: 1020 mg via INTRAVENOUS
  Filled 2012-10-09: qty 34

## 2012-10-09 MED ORDER — FERROUS SULFATE 325 (65 FE) MG PO TABS: 325.0000 mg | ORAL_TABLET | Freq: Two times a day (BID) | ORAL | Status: DC

## 2012-10-09 NOTE — Progress Notes (Signed)
Pt discharged home with spouse in stable condition. Discharge instructions given and pt verbalized understanding.

## 2012-10-09 NOTE — Discharge Summary (Signed)
Physician Discharge Summary  REBECA VALDIVIA UVO:[id number] DOB: February 07, 1961 DOA: 10/08/2012  PCP: Alva Garnet., MD  Admit date: 10/08/2012 Discharge date: 10/09/2012  Time spent:  Recommendations for Outpatient Follow-up:       Follow-up Information   Follow up with Alva Garnet., MD. (IN 1-2weeks as scheduled)    Contact information:   [address] [place] [address] [phone]       Please follow up. (GYN as scheduled)      Discharge Diagnoses:  Principal Problem:   Anemia/iron deficiency anemia Active Problems:   Elevated blood pressure   Discharge Condition: Improved/stable  Diet recommendation: 2 g sodium  Filed Weights   10/08/12 1734  Weight: 98.4 kg (216 lb 14.9 oz)    History of present illness:  DAVAN HARK is a 52 y.o. female  With history of fibroids and heavy periods. Presents to the ED with the above complaints. She states that this morning while she was fixing herself up she felt weak. Felt like she was going to faint but did not. Episode resolved without any intervention other than rest. As a result patient decided to come to the ED for further evaluation. The weakness has been present for months reportedly. Patient states that she has had history of heavy mentrual periods due to fibroids. Has had 3 different operations for her fibroids but continue to have heavy menstrual cycles. Also reports that recently she was placed on progesterone but this made her heavy periods worse and prolonged. This reportedly happened 2 weeks ago. She was admitted for further evaluation and management. Followup today she is states she feels much better, denies any further dizziness no weakness and no chest pain.  Hospital Course: 1.iron deficiency Anemia- secondary to chronic blood loss/menorrhagia patient with history of fibroids - Discussed above, upon admission she was transfused 2 units of packed red blood cells - Hemoglobin  improved from 7.6-9.5 and followup today  - And anemia panel was done and revealed a ferritin of 5, Iron level of 17- she was transfused IV iron and she is to continue oral iron upon discharge. - She is clinically improved and asymptomatic at this time, denies any further dizziness - recheck cbc post transfusion. Nursing to place order for post transfusion cbc per protocol  2. Elevated blood pressures, without prior history of hypertension  - patient reports being on hctz but reportedly not for blood pressure but for edema in her lower extremities.  - Her blood pressures were monitored and she's had a good control- no persistently elevated blood pressures. - She is to continue HCTZ and as previously and followup with her PCP for further monitoring and management as clinically appropriate.     Procedures:  none  Consultations:  none  Discharge Exam: Filed Vitals:   10/08/12 2124 10/08/12 2224 10/08/12 2317 10/09/12 0628  BP: 150/78 133/87 100/74 120/64  Pulse: 91 80 88 78  Temp: 98 F (36.7 C) 97.3 F (36.3 C) 97.6 F (36.4 C) 98.3 F (36.8 C)  TempSrc: Oral Oral Oral Oral  Resp: 20 20 20 18   Height:      Weight:      SpO2:    98%    General: Obese middle-aged female in no apparent distress Cardiovascular: Regular rate and rhythm normal S1-S2 Respiratory: clearTo auscultation bilaterally no crackles or wheezes Abdomen: Soft, bowel sounds present nontender nondistended no organomegaly and no masses palpable. Extremities: No cyanosis and no edema  Discharge Instructions  Discharge  Orders   Future Orders Complete By Expires     Diet - low sodium heart healthy  As directed     Increase activity slowly  As directed         Medication List    TAKE these medications       cetirizine 10 MG tablet  Commonly known as:  ZYRTEC  Take 10 mg by mouth daily.     ferrous sulfate 325 (65 FE) MG tablet  Take 1 tablet (325 mg total) by mouth 2 (two) times daily with a meal.      hydrochlorothiazide 25 MG tablet  Commonly known as:  HYDRODIURIL  Take 25 mg by mouth daily.     ibuprofen 200 MG tablet  Commonly known as:  ADVIL,MOTRIN  Take 200 mg by mouth every 6 (six) hours as needed for pain.           Follow-up Information   Follow up with Alva Garnet., MD. (IN 1-2weeks as scheduled)    Contact information:   [address] [place] [address] [phone]       Please follow up. (GYN as scheduled)        The results of significant diagnostics from this hospitalization (including imaging, microbiology, ancillary and laboratory) are listed below for reference.    Significant Diagnostic Studies: Dg Chest 2 View  10/08/2012  *RADIOLOGY REPORT*  Clinical Data: Dizziness and lightheadedness.  CHEST - 2 VIEW  Comparison: None.  Findings: Calcified granulomata are seen in the right middle lobe. Lungs otherwise clear.  No pneumothorax or pleural effusion.  Heart size upper normal.  IMPRESSION: No acute abnormality.   Original Report Authenticated By: Holley Dexter, M.D.    Dg Esophagus  09/21/2012  *RADIOLOGY REPORT*  Clinical Data: Dysphagia  ESOPHOGRAM/BARIUM SWALLOW  Technique:  Combined double contrast and single contrast examination performed using effervescent crystals, thick barium liquid, and thin barium liquid.  Fluoroscopy time:  1.5 minutes.  Comparison:  None.  Findings:  A double contrast study was performed.  The mucosa of the esophagus is unremarkable.  A single contrast study shows the swallowing mechanism to be normal.  Esophageal peristalsis is normal.  No hiatal hernia is seen.  There is only faint gastroesophageal reflux demonstrated.  A barium pill was given at the end of the study which passed into the stomach without delay.  IMPRESSION:  1.  Faint gastroesophageal reflux.  Barium pill passes into the stomach without delay. 2.  Normal esophageal peristalsis.   Original Report Authenticated By: Dwyane Dee, M.D.      Microbiology: No results found for this or any previous visit (from the past 240 hour(s)).   Labs: Basic Metabolic Panel:  Recent Labs Lab 10/08/12 1335  NA 139  K 3.9  CL 107  CO2 24  GLUCOSE 89  BUN 8  CREATININE 0.74  CALCIUM 8.8   Liver Function Tests:  Recent Labs Lab 10/08/12 1335  AST 11  ALT 8  ALKPHOS 76  BILITOT 0.2*  PROT 6.4  ALBUMIN 3.2*    Recent Labs Lab 10/08/12 1335  LIPASE 11   No results found for this basename: AMMONIA,  in the last 168 hours CBC:  Recent Labs Lab 10/08/12 1335 10/09/12 0355  WBC 7.2 7.2  NEUTROABS 3.9  --   HGB 7.6* 9.5*  HCT 26.1* 30.4*  MCV 64.0* 67.3*  PLT 377 334   Cardiac Enzymes: No results found for this basename: CKTOTAL, CKMB, CKMBINDEX, TROPONINI,  in the last 168 hours BNP: BNP (last 3 results)  Recent Labs  10/08/12 1335  PROBNP 14.2   CBG: No results found for this basename: GLUCAP,  in the last 168 hours     Signed:  VIYUOH,ADELINE C  [facility] 10/09/2012, 1:28 PM

## 2012-10-13 ENCOUNTER — Telehealth: Payer: Self-pay | Admitting: Obstetrics and Gynecology

## 2012-10-13 NOTE — Telephone Encounter (Signed)
TC to pt. States was not feeling well and went to ER 10/08/12. Received blood transfusion, Wanted to inform DR VPH.  Has D/C'd Provera. States no bleeding x 1 week. Has appt with Dr [facility] 10/23/12. Also advised F/U with PCP.

## 2012-11-11 ENCOUNTER — Other Ambulatory Visit: Payer: Self-pay | Admitting: Obstetrics and Gynecology

## 2013-01-15 ENCOUNTER — Other Ambulatory Visit: Payer: Self-pay | Admitting: Sports Medicine

## 2013-01-15 DIAGNOSIS — M545 Low back pain, unspecified: Secondary | ICD-10-CM

## 2013-01-27 ENCOUNTER — Other Ambulatory Visit: Payer: 59

## 2013-01-27 ENCOUNTER — Other Ambulatory Visit: Payer: Self-pay

## 2013-01-27 DIAGNOSIS — Z1231 Encounter for screening mammogram for malignant neoplasm of breast: Secondary | ICD-10-CM

## 2013-02-09 ENCOUNTER — Ambulatory Visit
Admission: RE | Admit: 2013-02-09 | Discharge: 2013-02-09 | Disposition: A | Discharge status: Home / Self Care | Payer: 59 | Source: Ambulatory Visit | Attending: Sports Medicine | Admitting: Sports Medicine

## 2013-02-09 DIAGNOSIS — M545 Low back pain, unspecified: Secondary | ICD-10-CM

## 2013-02-17 ENCOUNTER — Ambulatory Visit: Payer: 59

## 2013-02-23 ENCOUNTER — Ambulatory Visit: Payer: 59

## 2013-03-16 ENCOUNTER — Ambulatory Visit: Payer: 59

## 2013-03-25 ENCOUNTER — Ambulatory Visit: Payer: 59

## 2013-04-13 ENCOUNTER — Ambulatory Visit
Admission: RE | Admit: 2013-04-13 | Discharge: 2013-04-13 | Disposition: A | Discharge status: Home / Self Care | Payer: 59 | Source: Ambulatory Visit

## 2013-04-13 DIAGNOSIS — Z1231 Encounter for screening mammogram for malignant neoplasm of breast: Secondary | ICD-10-CM

## 2013-04-15 ENCOUNTER — Other Ambulatory Visit: Payer: Self-pay | Admitting: Obstetrics and Gynecology

## 2013-04-15 DIAGNOSIS — R928 Other abnormal and inconclusive findings on diagnostic imaging of breast: Secondary | ICD-10-CM

## 2013-05-03 ENCOUNTER — Ambulatory Visit
Admission: RE | Admit: 2013-05-03 | Discharge: 2013-05-03 | Disposition: A | Discharge status: Home / Self Care | Payer: 59 | Source: Ambulatory Visit | Attending: Obstetrics and Gynecology | Admitting: Obstetrics and Gynecology

## 2013-05-03 DIAGNOSIS — R928 Other abnormal and inconclusive findings on diagnostic imaging of breast: Secondary | ICD-10-CM

## 2013-06-10 ENCOUNTER — Ambulatory Visit: Payer: 59 | Admitting: Cardiology

## 2013-06-25 ENCOUNTER — Ambulatory Visit (INDEPENDENT_AMBULATORY_CARE_PROVIDER_SITE_OTHER): Payer: 59 | Admitting: Internal Medicine

## 2013-06-25 ENCOUNTER — Encounter: Payer: Self-pay | Admitting: Internal Medicine

## 2013-06-25 VITALS — BP 112/90 | HR 87 | Ht 63.5 in | Wt 237.7 lb

## 2013-06-25 DIAGNOSIS — R0609 Other forms of dyspnea: Secondary | ICD-10-CM | POA: Insufficient documentation

## 2013-06-25 DIAGNOSIS — R0989 Other specified symptoms and signs involving the circulatory and respiratory systems: Secondary | ICD-10-CM

## 2013-06-25 DIAGNOSIS — R0600 Dyspnea, unspecified: Secondary | ICD-10-CM

## 2013-06-25 DIAGNOSIS — R609 Edema, unspecified: Secondary | ICD-10-CM

## 2013-06-25 DIAGNOSIS — R600 Localized edema: Secondary | ICD-10-CM

## 2013-06-25 NOTE — Progress Notes (Signed)
OFFICE NOTE  Chief Complaint:  DOE, LE edema  Primary Care Physician: Alva Garnet., MD  HPI:  Whitney Foster is a pleasant 51 year old female kindly referred by Dr. Renae Gloss for evaluation of lower extremity edema. Her past medical history is significant for hypertension, heavy periods with associated anemia, and recent weight gain. From a cardiac standpoint both her mother and brother had congestive heart failure. Her mother actually had onset of congestive heart failure at age 45 and had a stroke and ultimately had a pacemaker placed and is still living at age 46. Her brother had some problems with drug use, but did also have heart failure and died at age 74. Recently Whitney Foster is noted some increased swelling of her lower extremities that is worse at the end of the day and does get better in the morning. She has also had some associated shortness of breath especially when walking up stairs. Unfortunately she's had about a 30 pound weight gain recently and is now in the morbid obese category. She says that she does feel like she gets adequate sleep at night denies any snoring, gasping or weakening with headaches concerning for possible sleep apnea. She reports her mother did have problems with swelling in her legs as well but did not necessarily have varicose veins. She's never been diagnosed with varicose veins or treated for such she does have one child and is on her feet quite a lot.  She denies any chest pain with exertion but is not particularly physically active.  PMHx:  Past Medical History  Diagnosis Date  . Hypertension   . Anemia   . Fibroids     "2000"    Past Surgical History  Procedure Laterality Date  . Cesarean section      "1998"  . Myomectomy abdominal approach      1994  . Myomectomy vaginal approach      1997    FAMHx:  Family History  Problem Relation Age of Onset  . Heart disease Mother 58  . Heart disease Brother   . Heart disease Maternal  Aunt     SOCHx:   reports that she has never smoked. She has never used smokeless tobacco. She reports that she does not drink alcohol or use illicit drugs.  ALLERGIES:  No Known Allergies  ROS: A comprehensive review of systems was negative except for: Respiratory: positive for dyspnea on exertion Cardiovascular: positive for lower extremity edema  HOME MEDS: Current Outpatient Prescriptions  Medication Sig Dispense Refill  . aspirin 81 MG tablet Take 81 mg by mouth daily.      . cetirizine (ZYRTEC) 10 MG tablet Take 10 mg by mouth daily.      . ferrous sulfate 325 (65 FE) MG tablet Take 325 mg by mouth daily.      . furosemide (LASIX) 20 MG tablet Take 20 mg by mouth daily.      Marland Kitchen ibuprofen (ADVIL,MOTRIN) 200 MG tablet Take 200 mg by mouth every 6 (six) hours as needed for pain.      Marland Kitchen losartan-hydrochlorothiazide (HYZAAR) 50-12.5 MG per tablet Take 1 tablet by mouth daily.      . Vitamin D, Ergocalciferol, (DRISDOL) 50000 UNITS CAPS capsule Take 50,000 Units by mouth 2 (two) times a week.       No current facility-administered medications for this visit.    LABS/IMAGING: No results found for this or any previous visit (from the past 48 hour(s)). No results found.  VITALS:  BP 112/90  Pulse 87  Ht 5' 3.5" (1.613 m)  Wt 237 lb 11.2 oz (107.82 kg)  BMI 41.44 kg/m2  EXAM: General appearance: alert and no distress Neck: no carotid bruit and no JVD Lungs: clear to auscultation bilaterally Heart: regular rate and rhythm, S1, S2 normal, no murmur, click, rub or gallop Abdomen: soft, non-tender; bowel sounds normal; no masses,  no organomegaly and morbidly obese Extremities: edema trace bilateral edema, no sequelae of varicose veins Pulses: 2+ and symmetric Skin: Skin color, texture, turgor normal. No rashes or lesions Neurologic: Grossly normal Psych: Mood, affect normal  EKG: Normal sinus rhythm at 87  ASSESSMENT: 1. Lower extremity edema 2. Dyspnea on  exertion 3. Recent 30 pound weight gain 4. Family history of premature cardiomyopathy  PLAN: 1.   Whitney Foster has had recent unexplained weight gain, shortness of breath with exertion and lower extremity swelling.  The swelling is somewhat dependent on gravity and improved somewhat in the morning. In fact today her swelling was almost completely gone. On exam there are no sequelae of venous insufficiency, however she could have deep vein reflux. This is not uncommon with venous hypertension secondary to morbid obesity. She flatly denied any concerning risk factors for sleep apnea. Her weight gain could be somewhat due to edema, however her lungs are clear I do not appreciate elevated jugular venous pressure. Would recommend an echocardiogram however given her history of premature cardiomyopathy in both her brother and mother. He'll be helpful to rule out systolic dysfunction. I also like to obtain enlarged and the venous Dopplers to evaluate for venous insufficiency. Plan to see her back to discuss results of these studies in a few weeks. I've encouraged her to avoid salt elevate her feet to help with swelling. She may ultimately need bilateral compression hose.  Thanks again for the kind referral.  Chrystie Nose, MD, [facility] Attending Cardiologist [facility]  HILTY,Kenneth C 06/25/2013, 10:02 AM

## 2013-06-25 NOTE — Patient Instructions (Signed)
Your physician has requested that you have an echocardiogram. Echocardiography is a painless test that uses sound waves to create images of your heart. It provides your doctor with information about the size and shape of your heart and how well your heart's chambers and valves are working. This procedure takes approximately one hour. There are no restrictions for this procedure.  Your physician has requested that you have a lower extremity venous duplex. This test is an ultrasound of the veins in the legs. It looks at venous blood flow that carries blood from the heart to the legs. Allow one hour for a Lower Venous exam. There are no restrictions or special instructions.  Your physician recommends that you schedule a follow-up appointment in: after your tests.

## 2013-06-28 ENCOUNTER — Ambulatory Visit (HOSPITAL_COMMUNITY)
Admission: RE | Admit: 2013-06-28 | Discharge: 2013-06-28 | Disposition: A | Discharge status: Home / Self Care | Payer: 59 | Source: Ambulatory Visit | Attending: Cardiology | Admitting: Cardiology

## 2013-06-28 DIAGNOSIS — R600 Localized edema: Secondary | ICD-10-CM

## 2013-06-28 DIAGNOSIS — R609 Edema, unspecified: Secondary | ICD-10-CM

## 2013-06-28 DIAGNOSIS — M7989 Other specified soft tissue disorders: Secondary | ICD-10-CM | POA: Insufficient documentation

## 2013-06-28 NOTE — Progress Notes (Signed)
Lower Extremity Venous Duplex Completed. °Brianna L Mazza,RVT °

## 2013-06-30 ENCOUNTER — Encounter: Payer: Self-pay | Admitting: Internal Medicine

## 2013-07-14 ENCOUNTER — Ambulatory Visit (HOSPITAL_COMMUNITY)
Admission: RE | Admit: 2013-07-14 | Discharge: 2013-07-14 | Disposition: A | Discharge status: Home / Self Care | Payer: 59 | Source: Ambulatory Visit | Attending: Cardiovascular Disease | Admitting: Cardiovascular Disease

## 2013-07-14 DIAGNOSIS — R600 Localized edema: Secondary | ICD-10-CM

## 2013-07-14 DIAGNOSIS — R0600 Dyspnea, unspecified: Secondary | ICD-10-CM

## 2013-07-14 DIAGNOSIS — R0609 Other forms of dyspnea: Secondary | ICD-10-CM | POA: Insufficient documentation

## 2013-07-14 DIAGNOSIS — R0602 Shortness of breath: Secondary | ICD-10-CM

## 2013-07-14 DIAGNOSIS — R609 Edema, unspecified: Secondary | ICD-10-CM | POA: Insufficient documentation

## 2013-07-14 DIAGNOSIS — R0989 Other specified symptoms and signs involving the circulatory and respiratory systems: Secondary | ICD-10-CM | POA: Insufficient documentation

## 2013-07-14 NOTE — Progress Notes (Signed)
2D Echo Performed 07/14/2013    Nathyn Luiz, RCS  

## 2013-07-19 ENCOUNTER — Ambulatory Visit (INDEPENDENT_AMBULATORY_CARE_PROVIDER_SITE_OTHER): Payer: 59 | Admitting: Internal Medicine

## 2013-07-19 ENCOUNTER — Encounter: Payer: Self-pay | Admitting: Internal Medicine

## 2013-07-19 VITALS — BP 144/90 | HR 80 | Ht 63.5 in | Wt 242.9 lb

## 2013-07-19 DIAGNOSIS — R0609 Other forms of dyspnea: Secondary | ICD-10-CM

## 2013-07-19 DIAGNOSIS — R600 Localized edema: Secondary | ICD-10-CM

## 2013-07-19 DIAGNOSIS — R0989 Other specified symptoms and signs involving the circulatory and respiratory systems: Secondary | ICD-10-CM

## 2013-07-19 DIAGNOSIS — R0600 Dyspnea, unspecified: Secondary | ICD-10-CM

## 2013-07-19 DIAGNOSIS — R609 Edema, unspecified: Secondary | ICD-10-CM

## 2013-07-19 NOTE — Progress Notes (Signed)
OFFICE NOTE  Chief Complaint:  DOE, LE edema  Primary Care Physician: Alva Garnet., MD  HPI:  Whitney Foster is a pleasant 52 year old female kindly referred by Dr. Renae Gloss for evaluation of lower extremity edema. Her past medical history is significant for hypertension, heavy periods with associated anemia, and recent weight gain. From a cardiac standpoint both her mother and brother had congestive heart failure. Her mother actually had onset of congestive heart failure at age 49 and had a stroke and ultimately had a pacemaker placed and is still living at age 95. Her brother had some problems with drug use, but did also have heart failure and died at age 21. Recently Whitney Foster is noted some increased swelling of her lower extremities that is worse at the end of the day and does get better in the morning. She has also had some associated shortness of breath especially when walking up stairs. Unfortunately she's had about a 30 pound weight gain recently and is now in the morbid obese category. She says that she does feel like she gets adequate sleep at night denies any snoring, gasping or weakening with headaches concerning for possible sleep apnea. She reports her mother did have problems with swelling in her legs as well but did not necessarily have varicose veins. She's never been diagnosed with varicose veins or treated for such she does have one child and is on her feet quite a lot.  She denies any chest pain with exertion but is not particularly physically active.  At her last office visit I recommended an echocardiogram and lower extremity venous Dopplers. She underwent those studies which showed essentially normal echocardiogram with normal systolic and diastolic function as well as normal right heart function. Her lower extremity Dopplers were negative for DVT and there was no evidence of either deep vein or superficial venous insufficiency.  She does report to fairly regular  dietary salt indiscretion.  PMHx:  Past Medical History  Diagnosis Date  . Hypertension   . Anemia   . Fibroids     "2000"    Past Surgical History  Procedure Laterality Date  . Cesarean section      "1998"  . Myomectomy abdominal approach      1994  . Myomectomy vaginal approach      1997    FAMHx:  Family History  Problem Relation Age of Onset  . Heart disease Mother 32  . Heart disease Brother   . Heart disease Maternal Aunt     SOCHx:   reports that she has never smoked. She has never used smokeless tobacco. She reports that she does not drink alcohol or use illicit drugs.  ALLERGIES:  No Known Allergies  ROS: A comprehensive review of systems was negative except for: Respiratory: positive for dyspnea on exertion Cardiovascular: positive for lower extremity edema  HOME MEDS: Current Outpatient Prescriptions  Medication Sig Dispense Refill  . aspirin 81 MG tablet Take 81 mg by mouth daily.      . cetirizine (ZYRTEC) 10 MG tablet Take 10 mg by mouth daily.      . ferrous sulfate 325 (65 FE) MG tablet Take 325 mg by mouth daily.      . furosemide (LASIX) 20 MG tablet Take 20 mg by mouth daily.      Marland Kitchen ibuprofen (ADVIL,MOTRIN) 200 MG tablet Take 200 mg by mouth every 6 (six) hours as needed for pain.      Marland Kitchen losartan-hydrochlorothiazide (HYZAAR) 50-12.5 MG  per tablet Take 1 tablet by mouth daily.      . Vitamin D, Ergocalciferol, (DRISDOL) 50000 UNITS CAPS capsule Take 50,000 Units by mouth 2 (two) times a week.       No current facility-administered medications for this visit.    LABS/IMAGING: No results found for this or any previous visit (from the past 48 hour(s)). No results found.  VITALS: BP 144/90  Pulse 80  Ht 5' 3.5" (1.613 m)  Wt 242 lb 14.4 oz (110.179 kg)  BMI 42.35 kg/m2  EXAM: deferred  EKG: deferred  ASSESSMENT: 1. Lower extremity edema - persistent, may be due to lymphedema 2. Dyspnea on exertion - essentially normal echo, suspect  due to obesity 3. Recent 30 pound weight gain 4. Family history of premature cardiomyopathy  PLAN: 1.   Mrs. Rosemeyer had an essentially normal echo without a clear cardiac cause of her lower sternum he swelling. Her venous Dopplers also failed to show any insufficiency as she may have an element of lymphedema. She does report swelling in her legs that is worse as the day goes on especially when seated and improves by the morning after keeping her feet elevated at night. She reported virtually no swelling when she was ill he other week however she was not on her feet much. 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 We will go ahead and fit her for those in the office today. Hopefully this will reduce her need for diuretics. I have again stressed the importance of exercise and weight loss which may make a big difference in her swelling.  Thanks again for the kind referral.  I can see her back as needed.  Chrystie Nose, MD, [facility] Attending Cardiologist [facility]  HILTY,Kenneth C 07/19/2013, 2:44 PM

## 2013-07-19 NOTE — Patient Instructions (Signed)
Dr. Rennis Golden has ordered compression stockings - knee high - 20-35mmHg - SIZE Medium  R calf 17.5 in R ankle 8 in L calf 17 in L ankle 8.5 in  Your physician recommends that you schedule a follow-up appointment as needed.

## 2013-09-23 IMAGING — CR DG CHEST 2V
2 series · 2 of 2 positions shown · non-contrast
Comparison: None.

CLINICAL DATA: Dizziness and lightheadedness.

CHEST - 2 VIEW

[w chest pa]
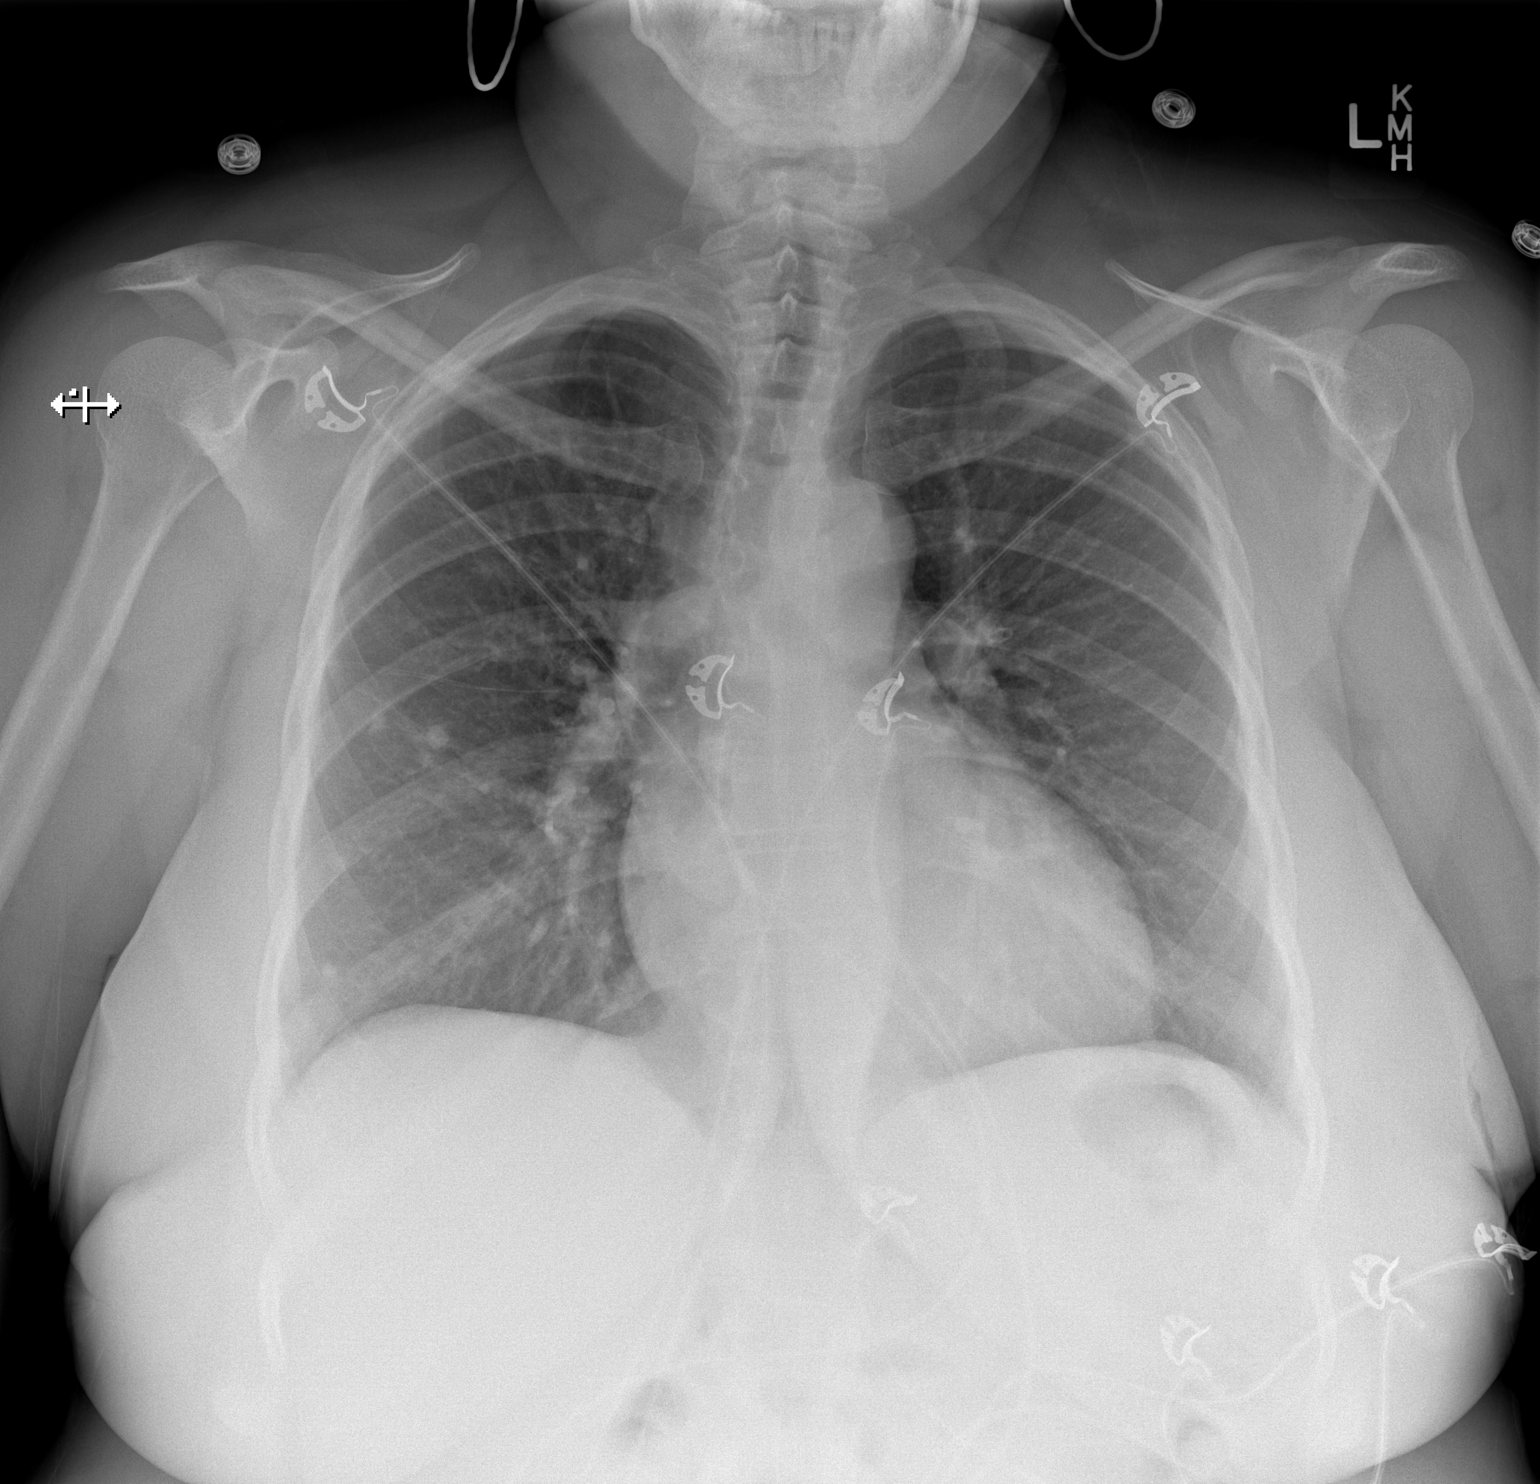

[w chest lat]
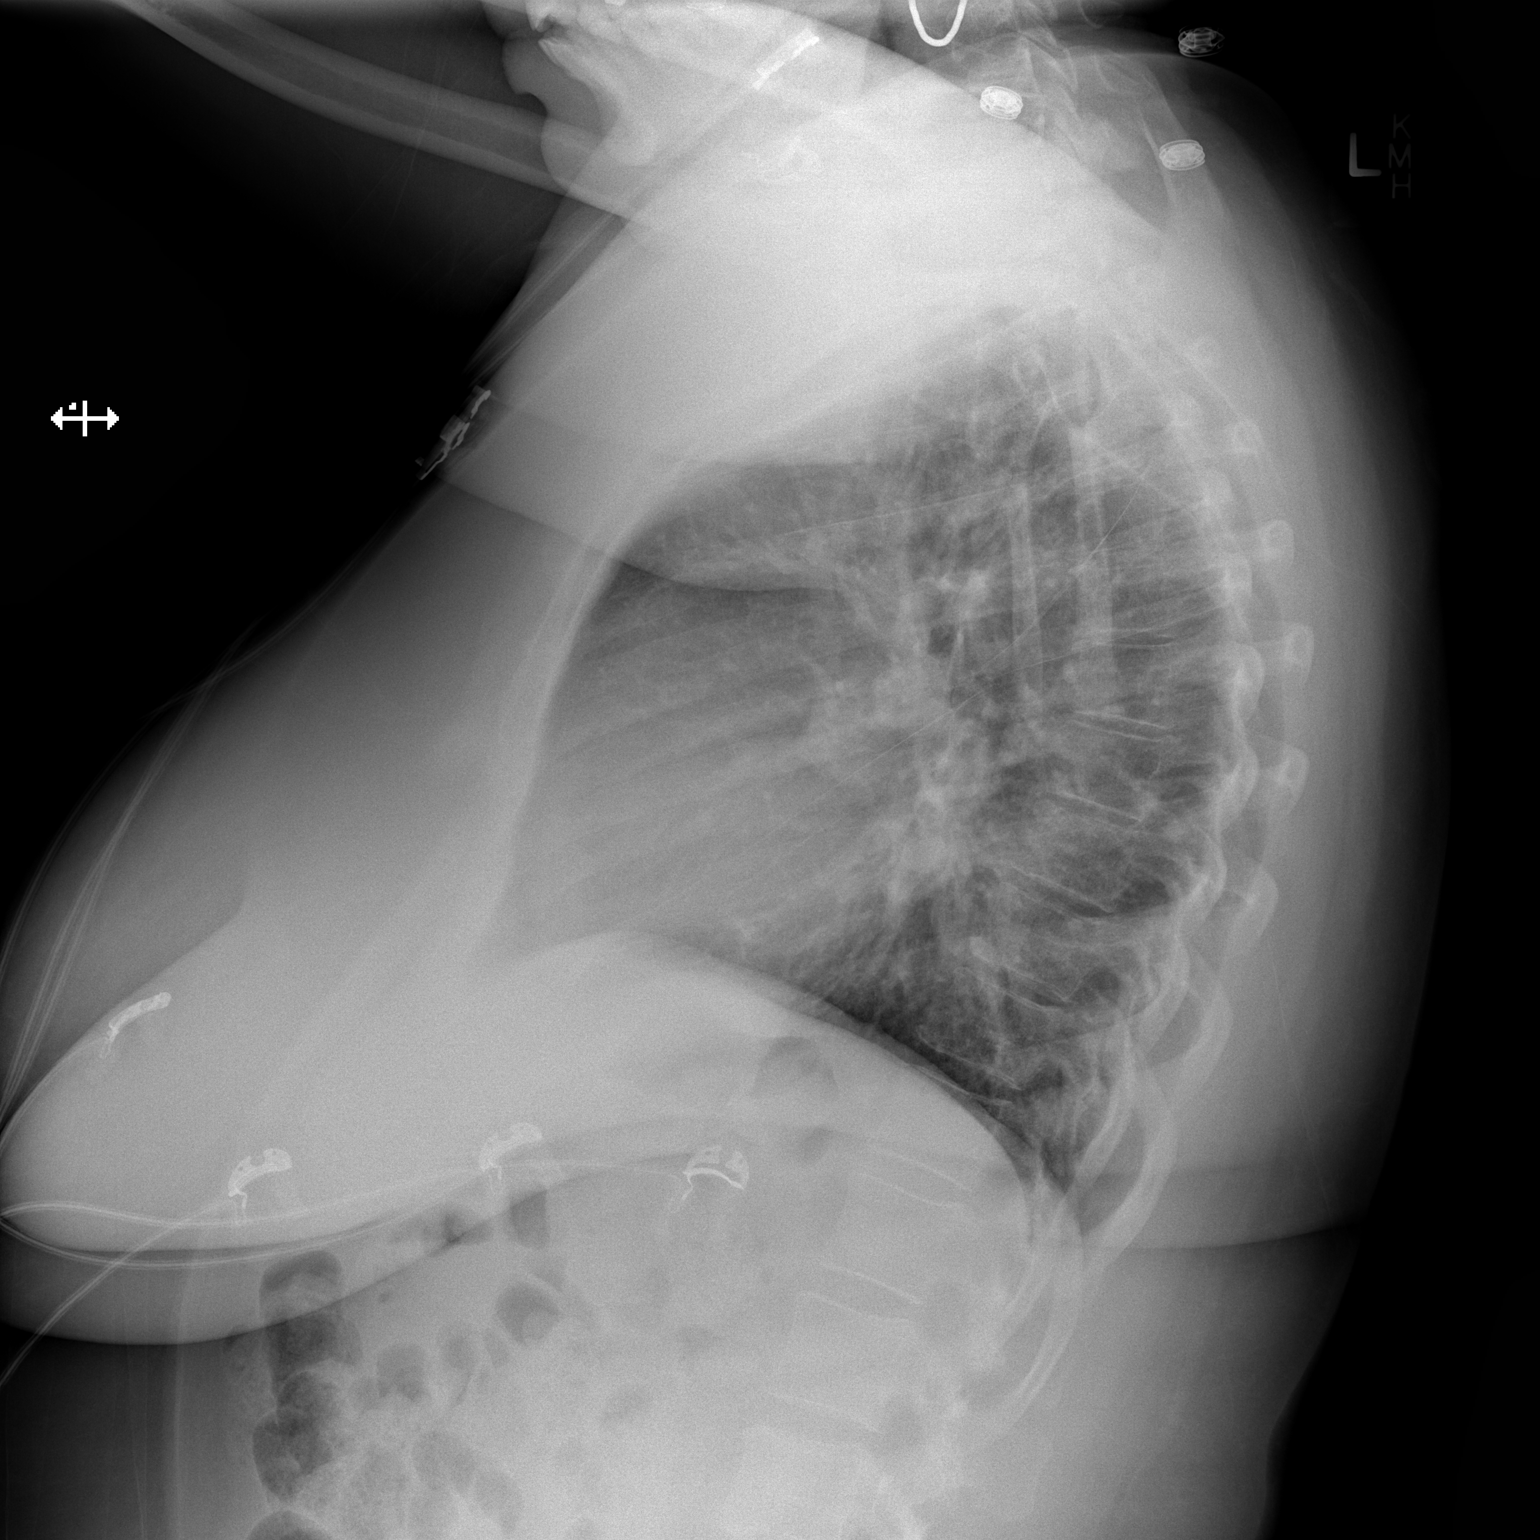

[2 of 2 positions shown; findings below may reference images not displayed]

FINDINGS: Calcified granulomata are seen in the right middle lobe.
Lungs otherwise clear.  No pneumothorax or pleural effusion.  Heart
size upper normal.
IMPRESSION: No acute abnormality.

## 2013-10-04 ENCOUNTER — Telehealth: Payer: Self-pay | Admitting: Internal Medicine

## 2013-10-04 NOTE — Telephone Encounter (Signed)
Pt have pain in her left arm that will not go away. She was on acid reflux medicine,but have stop taking Prilosec. She was wondering if she might need to be seen?

## 2013-10-04 NOTE — Telephone Encounter (Signed)
Returned call and pt verified x 2.  Pt state she wen to ENT b/c of dry throat and burning.  Stated prescribed Prilosec for reflux and it made it worse and caused arm pain.  Stated she stopped it and started taking otc acid reducer and the reflux is better, but the arm pain is still there.  Pt c/o pain in L shoulder (described deltoid muscle).  Denied pain in joint or radiation of pain.  Also denied SOB or CP.  Stated she does have some numb/tingling in AM when she wakes up and also c/o having difficulty lifting the arm.    Advice  Contact PCP r/t L shoulder/arm pain  Pt informed symptoms do not sound like they are heart-related.  Pt verbalized understanding and agreed w/ plan.

## 2013-10-04 NOTE — Telephone Encounter (Signed)
Doesn't sound cardiac - but we are always willing to see her if symptoms get worse.  -Dr. Debara Pickett

## 2014-06-06 ENCOUNTER — Encounter: Payer: Self-pay | Admitting: Internal Medicine

## 2014-06-14 ENCOUNTER — Other Ambulatory Visit: Payer: Self-pay

## 2014-06-14 DIAGNOSIS — Z1231 Encounter for screening mammogram for malignant neoplasm of breast: Secondary | ICD-10-CM

## 2014-07-07 ENCOUNTER — Ambulatory Visit: Payer: 59

## 2014-07-18 ENCOUNTER — Ambulatory Visit: Payer: 59

## 2014-07-22 ENCOUNTER — Ambulatory Visit
Admission: RE | Admit: 2014-07-22 | Discharge: 2014-07-22 | Disposition: A | Discharge status: Home / Self Care | Payer: 59 | Source: Ambulatory Visit

## 2014-07-22 DIAGNOSIS — Z1231 Encounter for screening mammogram for malignant neoplasm of breast: Secondary | ICD-10-CM

## 2016-02-01 DIAGNOSIS — R609 Edema, unspecified: Secondary | ICD-10-CM | POA: Insufficient documentation

## 2016-02-21 ENCOUNTER — Other Ambulatory Visit (INDEPENDENT_AMBULATORY_CARE_PROVIDER_SITE_OTHER): Payer: Self-pay | Admitting: Otolaryngology

## 2016-02-21 DIAGNOSIS — J0101 Acute recurrent maxillary sinusitis: Secondary | ICD-10-CM

## 2016-02-27 ENCOUNTER — Ambulatory Visit
Admission: RE | Admit: 2016-02-27 | Discharge: 2016-02-27 | Disposition: A | Discharge status: Home / Self Care | Payer: 59 | Source: Ambulatory Visit | Attending: Otolaryngology | Admitting: Otolaryngology

## 2016-02-27 ENCOUNTER — Other Ambulatory Visit: Payer: Self-pay

## 2016-02-27 DIAGNOSIS — J0101 Acute recurrent maxillary sinusitis: Secondary | ICD-10-CM

## 2016-02-27 DIAGNOSIS — E669 Obesity, unspecified: Secondary | ICD-10-CM | POA: Insufficient documentation

## 2016-02-27 DIAGNOSIS — R635 Abnormal weight gain: Secondary | ICD-10-CM | POA: Insufficient documentation

## 2016-09-05 DIAGNOSIS — D509 Iron deficiency anemia, unspecified: Secondary | ICD-10-CM | POA: Diagnosis not present

## 2016-09-05 DIAGNOSIS — I10 Essential (primary) hypertension: Secondary | ICD-10-CM | POA: Diagnosis not present

## 2016-09-26 ENCOUNTER — Ambulatory Visit (INDEPENDENT_AMBULATORY_CARE_PROVIDER_SITE_OTHER): Payer: 59 | Admitting: Cardiology

## 2016-09-26 ENCOUNTER — Ambulatory Visit: Payer: 59 | Admitting: Student

## 2016-09-26 ENCOUNTER — Encounter: Payer: Self-pay | Admitting: Cardiology

## 2016-09-26 ENCOUNTER — Ambulatory Visit: Payer: 59 | Admitting: Physician Assistant

## 2016-09-26 VITALS — BP 136/83 | HR 72 | Ht 63.5 in | Wt 239.0 lb

## 2016-09-26 DIAGNOSIS — G4730 Sleep apnea, unspecified: Secondary | ICD-10-CM

## 2016-09-26 DIAGNOSIS — R5383 Other fatigue: Secondary | ICD-10-CM | POA: Diagnosis not present

## 2016-09-26 DIAGNOSIS — Z72820 Sleep deprivation: Secondary | ICD-10-CM | POA: Diagnosis not present

## 2016-09-26 DIAGNOSIS — R0609 Other forms of dyspnea: Secondary | ICD-10-CM | POA: Diagnosis not present

## 2016-09-26 DIAGNOSIS — I10 Essential (primary) hypertension: Secondary | ICD-10-CM | POA: Diagnosis not present

## 2016-09-26 DIAGNOSIS — R001 Bradycardia, unspecified: Secondary | ICD-10-CM

## 2016-09-26 DIAGNOSIS — R0600 Dyspnea, unspecified: Secondary | ICD-10-CM

## 2016-09-26 NOTE — Progress Notes (Signed)
09/26/2016 Whitney Foster   03/14/1961  MU:[id number]  Primary Physician No primary care provider on file. Primary Cardiologist: Dr Debara Pickett  HPI:  56 y/o obese AA female with a history of HTN and LE edema. She saw Dr Debara Pickett in 2014. Echo then showed normal LVF, venous dopplers negative for DVT. She presents to the office now with multiple complaints. The pt says she has noticed increased daytime fatigue and DOE the last 2-3 months. She was at a function a few days ago and became "weak". An RN was present and took her pulse and told her it was slow- "in the 40's" and suggested she see her cardiologist. She also tells me she has noticed herself falling asleep at her desk at work. She admits to snoring at night. She recently saw Dr Delfina Redwood who drew labs and told it all looked good.    Current Outpatient Prescriptions  Medication Sig Dispense Refill  . aspirin 81 MG tablet Take 81 mg by mouth daily.    . cetirizine (ZYRTEC) 10 MG tablet Take 10 mg by mouth daily.    . ferrous sulfate 325 (65 FE) MG tablet Take 325 mg by mouth daily.    Marland Kitchen ibuprofen (ADVIL,MOTRIN) 200 MG tablet Take 200 mg by mouth every 6 (six) hours as needed for pain.    Marland Kitchen losartan-hydrochlorothiazide (HYZAAR) 50-12.5 MG per tablet Take 1 tablet by mouth daily.     No current facility-administered medications for this visit.     Allergies  Allergen Reactions  . Hydrocodone-Acetaminophen     Other reaction(s): GI Upset (intolerance)    Social History   Social History  . Marital status: Married    Spouse name: N/A  . Number of children: N/A  . Years of education: N/A   Occupational History  . Not on file.   Social History Main Topics  . Smoking status: Never Smoker  . Smokeless tobacco: Never Used  . Alcohol use No  . Drug use: No  . Sexual activity: Yes    Birth control/ protection: None   Other Topics Concern  . Not on file   Social History Narrative  . No narrative on file     Review of  Systems: General: negative for chills, fever, night sweats or weight changes.  Cardiovascular: negative for chest pain, dyspnea on exertion, edema, orthopnea, palpitations, paroxysmal nocturnal dyspnea or shortness of breath Dermatological: negative for rash Respiratory: negative for cough or wheezing Urologic: negative for hematuria Abdominal: negative for nausea, vomiting, diarrhea, bright red blood per rectum, melena, or hematemesis Neurologic: negative for visual changes, syncope, or dizziness All other systems reviewed and are otherwise negative except as noted above.    Blood pressure 136/83, pulse 72, height 5' 3.5" (1.613 m), weight 239 lb (108.4 kg).  General appearance: alert, cooperative, no distress and morbidly obese Neck: no carotid bruit and no JVD Lungs: clear to auscultation bilaterally Heart: regular rate and rhythm Extremities: 1+ LE edema Pulses: 2+ and symmetric Skin: Skin color, texture, turgor normal. No rashes or lesions Neurologic: Grossly normal  EKG NSR-70  ASSESSMENT AND PLAN:   DOE (dyspnea on exertion) Pt here for evaluation of DOE which she feels is new in the past 2-3 months.  Bradycardia She ran recently had an episode of weakness and was told her HR was in the 40's  Morbid obesity BMI 41  Essential hypertension Controlled  Sleep apnea Suspected sleep apnea with history of snoring, falling asleep at her desk, daytime  fatigue   PLAN  I suggested we get an echo, a sleep study, and have her wear a 7 day monitor. We' request labs from Dr Polite's office. F/U with Dr Debara Pickett after the above tests obtained.   Kerin Ransom PA-C 09/26/2016 4:16 PM

## 2016-09-26 NOTE — Assessment & Plan Note (Signed)
Controlled.  

## 2016-09-26 NOTE — Assessment & Plan Note (Signed)
BMI 41 

## 2016-09-26 NOTE — Patient Instructions (Signed)
Medication Instructions:  Your physician recommends that you continue on your current medications as directed. Please refer to the Current Medication list given to you today.  If you need a refill on your cardiac medications before your next appointment, please call your pharmacy.  Labwork: NONE  Testing/Procedures: Your physician has recommended that you have a sleep study. This test records several body functions during sleep, including: brain activity, eye movement, oxygen and carbon dioxide blood levels, heart rate and rhythm, breathing rate and rhythm, the flow of air through your mouth and nose, snoring, body muscle movements, and chest and belly movement.  Your physician has requested that you have an echocardiogram. Echocardiography is a painless test that uses sound waves to create images of your heart. It provides your doctor with information about the size and shape of your heart and how well your heart's chambers and valves are working. This procedure takes approximately one hour. There are no restrictions for this procedure.  Your physician has recommended that you wear an event monitor. Event monitors are medical devices that record the heart's electrical activity. Doctors most often [name] these monitors to diagnose arrhythmias. Arrhythmias are problems with the speed or rhythm of the heartbeat. The monitor is a small, portable device. You can wear one while you do your normal daily activities. This is usually used to diagnose what is causing palpitations/syncope (passing out).  Follow-Up: Your physician recommends that you schedule a follow-up appointment in: WITH DR HILTY--AFTER ABOVE TESTING/MONITOR   Thank you for choosing [facility] at [facility]!!    Kerby Moors, LPN

## 2016-09-26 NOTE — Progress Notes (Deleted)
Cardiology Office Note    Date:  09/26/2016   ID:  Whitney Foster, DOB 09-25-1960, MRN [id number]  PCP:  No primary care provider on file.  Cardiologist:  New (previously seen by Dr. Debara Pickett 2014)  Chief Complaint:   History of Present Illness:   Whitney Foster is a 56 y.o. female ***   Past Medical History:  Diagnosis Date  . Anemia   . Fibroids    "2000"  . Hypertension     Past Surgical History:  Procedure Laterality Date  . CESAREAN SECTION     "1998"  . MYOMECTOMY ABDOMINAL APPROACH     1994  . MYOMECTOMY VAGINAL APPROACH     1997    Current Medications: Prior to Admission medications   Medication Sig Start Date End Date Taking? Authorizing Provider  aspirin 81 MG tablet Take 81 mg by mouth daily.    Historical Provider, MD  cetirizine (ZYRTEC) 10 MG tablet Take 10 mg by mouth daily.    Historical Provider, MD  ferrous sulfate 325 (65 FE) MG tablet Take 325 mg by mouth daily. 10/09/12   Sheila Oats, MD  furosemide (LASIX) 20 MG tablet Take 20 mg by mouth daily.    Historical Provider, MD  ibuprofen (ADVIL,MOTRIN) 200 MG tablet Take 200 mg by mouth every 6 (six) hours as needed for pain.    Historical Provider, MD  losartan-hydrochlorothiazide (HYZAAR) 50-12.5 MG per tablet Take 1 tablet by mouth daily.    Historical Provider, MD  Vitamin D, Ergocalciferol, (DRISDOL) 50000 UNITS CAPS capsule Take 50,000 Units by mouth 2 (two) times a week.    Historical Provider, MD    Allergies:   Patient has no known allergies.   Social History   Social History  . Marital status: Married    Spouse name: N/A  . Number of children: N/A  . Years of education: N/A   Social History Main Topics  . Smoking status: Never Smoker  . Smokeless tobacco: Never Used  . Alcohol use No  . Drug use: No  . Sexual activity: Yes    Birth control/ protection: None   Other Topics Concern  . Not on file   Social History Narrative  . No narrative on file     Family  History:  The patient's family history includes Heart disease in her brother and maternal aunt; Heart disease (age of onset: 55) in her mother. ***  ROS:   Please see the history of present illness.    ROS All other systems reviewed and are negative.   PHYSICAL EXAM:   VS:  There were no vitals taken for this visit.   GEN: Well nourished, well developed, in no acute distress  HEENT: normal  Neck: no JVD, carotid bruits, or masses Cardiac: ***RRR; no murmurs, rubs, or gallops,no edema  Respiratory:  clear to auscultation bilaterally, normal work of breathing GI: soft, nontender, nondistended, + BS MS: no deformity or atrophy  Skin: warm and dry, no rash Neuro:  Alert and Oriented x 3, Strength and sensation are intact Psych: euthymic mood, full affect  Wt Readings from Last 3 Encounters:  07/19/13 242 lb 14.4 oz (110.2 kg)  06/25/13 237 lb 11.2 oz (107.8 kg)  10/08/12 216 lb 14.9 oz (98.4 kg)      Studies/Labs Reviewed:   EKG:  EKG is ordered today.  The ekg ordered today demonstrates ***  Recent Labs: No results found for requested labs within last 8760 hours.  Lipid Panel No results found for: CHOL, TRIG, HDL, CHOLHDL, VLDL, LDLCALC, LDLDIRECT  Additional studies/ records that were reviewed today include:   Echocardiogram:  Cardiac Catheterization:     ASSESSMENT & PLAN:    1. ***    Medication Adjustments/Labs and Tests Ordered: Current medicines are reviewed at length with the patient today.  Concerns regarding medicines are outlined above.  Medication changes, Labs and Tests ordered today are listed in the Patient Instructions below. There are no Patient Instructions on file for this visit.   Jarrett Soho, [place]  09/26/2016 7:41 AM    [facility] [address], [place], [place]  [address] Phone: [phone]; Fax: [phone]

## 2016-09-26 NOTE — Assessment & Plan Note (Signed)
Pt here for evaluation of DOE which she feels is new in the past 2-3 months.

## 2016-09-26 NOTE — Assessment & Plan Note (Signed)
She ran recently had an episode of weakness and was told her HR was in the 40's

## 2016-09-26 NOTE — Assessment & Plan Note (Signed)
Suspected sleep apnea with history of snoring, falling asleep at her desk, daytime fatigue

## 2016-10-01 ENCOUNTER — Ambulatory Visit: Payer: 59 | Admitting: Physician Assistant

## 2016-10-02 ENCOUNTER — Other Ambulatory Visit: Payer: Self-pay | Admitting: Cardiology

## 2016-10-02 ENCOUNTER — Ambulatory Visit (INDEPENDENT_AMBULATORY_CARE_PROVIDER_SITE_OTHER): Payer: 59

## 2016-10-02 DIAGNOSIS — R001 Bradycardia, unspecified: Secondary | ICD-10-CM | POA: Diagnosis not present

## 2016-10-02 DIAGNOSIS — R0609 Other forms of dyspnea: Secondary | ICD-10-CM

## 2016-10-02 DIAGNOSIS — R5383 Other fatigue: Secondary | ICD-10-CM

## 2016-10-02 DIAGNOSIS — R0600 Dyspnea, unspecified: Secondary | ICD-10-CM

## 2016-10-16 ENCOUNTER — Other Ambulatory Visit (HOSPITAL_COMMUNITY): Payer: 59

## 2016-10-23 ENCOUNTER — Other Ambulatory Visit: Payer: Self-pay

## 2016-10-23 ENCOUNTER — Ambulatory Visit (HOSPITAL_COMMUNITY): Payer: 59 | Attending: Internal Medicine

## 2016-10-23 DIAGNOSIS — Z6841 Body Mass Index (BMI) 40.0 and over, adult: Secondary | ICD-10-CM | POA: Insufficient documentation

## 2016-10-23 DIAGNOSIS — R0609 Other forms of dyspnea: Secondary | ICD-10-CM | POA: Diagnosis not present

## 2016-10-23 DIAGNOSIS — G4730 Sleep apnea, unspecified: Secondary | ICD-10-CM | POA: Insufficient documentation

## 2016-10-23 DIAGNOSIS — I10 Essential (primary) hypertension: Secondary | ICD-10-CM | POA: Insufficient documentation

## 2016-10-23 DIAGNOSIS — R600 Localized edema: Secondary | ICD-10-CM | POA: Diagnosis not present

## 2016-10-23 DIAGNOSIS — D649 Anemia, unspecified: Secondary | ICD-10-CM | POA: Insufficient documentation

## 2016-10-23 DIAGNOSIS — I361 Nonrheumatic tricuspid (valve) insufficiency: Secondary | ICD-10-CM | POA: Insufficient documentation

## 2016-10-23 DIAGNOSIS — R5383 Other fatigue: Secondary | ICD-10-CM | POA: Diagnosis not present

## 2016-10-23 DIAGNOSIS — R001 Bradycardia, unspecified: Secondary | ICD-10-CM | POA: Diagnosis not present

## 2016-10-23 DIAGNOSIS — R0600 Dyspnea, unspecified: Secondary | ICD-10-CM

## 2016-10-23 LAB — ECHOCARDIOGRAM COMPLETE
Ao-asc: 36 cm
E decel time: 130 msec
E/e' ratio: 6.89
FS: 36 % (ref 28–44)
IVS/LV PW RATIO, ED: 1.11
LA ID, A-P, ES: 28 mm
LA diam end sys: 28 mm
LA diam index: 1.33 cm/m2
LA vol A4C: 39.4 ml
LA vol index: 18.5 mL/m2
LA vol: 38.8 mL
LV E/e' medial: 6.89
LV E/e'average: 6.89
LV PW d: 9.85 mm — AB (ref 0.6–1.1)
LV e' LATERAL: 9.03 cm/s
LVOT SV: 74 mL
LVOT VTI: 23.5 cm
LVOT area: 3.14 cm2
LVOT diameter: 20 mm
LVOT peak vel: 101 cm/s
Lateral S' vel: 12.1 cm/s
MV Dec: 130
MV pk A vel: 57.2 m/s
MV pk E vel: 62.2 m/s
Reg peak vel: 231 cm/s
TAPSE: 18.9 mm
TDI e' lateral: 9.03
TDI e' medial: 6.53
TR max vel: 231 cm/s

## 2016-10-31 DIAGNOSIS — H10413 Chronic giant papillary conjunctivitis, bilateral: Secondary | ICD-10-CM | POA: Diagnosis not present

## 2016-11-07 DIAGNOSIS — H10413 Chronic giant papillary conjunctivitis, bilateral: Secondary | ICD-10-CM | POA: Diagnosis not present

## 2016-11-15 ENCOUNTER — Ambulatory Visit (HOSPITAL_BASED_OUTPATIENT_CLINIC_OR_DEPARTMENT_OTHER): Payer: 59

## 2016-11-21 DIAGNOSIS — R200 Anesthesia of skin: Secondary | ICD-10-CM | POA: Diagnosis not present

## 2016-11-29 DIAGNOSIS — L309 Dermatitis, unspecified: Secondary | ICD-10-CM | POA: Diagnosis not present

## 2016-12-02 ENCOUNTER — Ambulatory Visit: Payer: 59 | Admitting: Internal Medicine

## 2016-12-02 DIAGNOSIS — L309 Dermatitis, unspecified: Secondary | ICD-10-CM | POA: Diagnosis not present

## 2016-12-13 ENCOUNTER — Encounter (HOSPITAL_BASED_OUTPATIENT_CLINIC_OR_DEPARTMENT_OTHER): Payer: 59

## 2016-12-24 DIAGNOSIS — G5601 Carpal tunnel syndrome, right upper limb: Secondary | ICD-10-CM | POA: Diagnosis not present

## 2016-12-24 DIAGNOSIS — G5602 Carpal tunnel syndrome, left upper limb: Secondary | ICD-10-CM | POA: Diagnosis not present

## 2017-01-02 ENCOUNTER — Encounter: Payer: Self-pay | Admitting: Internal Medicine

## 2017-01-02 ENCOUNTER — Ambulatory Visit (INDEPENDENT_AMBULATORY_CARE_PROVIDER_SITE_OTHER): Payer: 59 | Admitting: Internal Medicine

## 2017-01-02 DIAGNOSIS — R5383 Other fatigue: Secondary | ICD-10-CM | POA: Insufficient documentation

## 2017-01-02 DIAGNOSIS — R001 Bradycardia, unspecified: Secondary | ICD-10-CM

## 2017-01-02 DIAGNOSIS — R0600 Dyspnea, unspecified: Secondary | ICD-10-CM

## 2017-01-02 DIAGNOSIS — R0609 Other forms of dyspnea: Secondary | ICD-10-CM | POA: Diagnosis not present

## 2017-01-02 NOTE — Patient Instructions (Signed)
Your physician recommends that you schedule a follow-up appointment in THREE MONTHS with Dr. Hilty.  

## 2017-01-02 NOTE — Progress Notes (Signed)
OFFICE NOTE  Chief Complaint:  Fatigue, LE edema  Primary Care Physician: Whitney Carol, MD  HPI:  Whitney Foster is a pleasant 56 year old female kindly referred by Dr. Karlton Foster for evaluation of lower extremity edema. Her past medical history is significant for hypertension, heavy periods with associated anemia, and recent weight gain. From a cardiac standpoint both her mother and brother had congestive heart failure. Her mother actually had onset of congestive heart failure at age 61 and had a stroke and ultimately had a pacemaker placed and is still living at age 13. Her brother had some problems with drug use, but did also have heart failure and died at age 53. Recently Ms. Foster is noted some increased swelling of her lower extremities that is worse at the end of the day and does get better in the morning. She has also had some associated shortness of breath especially when walking up stairs. Unfortunately she's had about a 30 pound weight gain recently and is now in the morbid obese category. She says that she does feel like she gets adequate sleep at night denies any snoring, gasping or weakening with headaches concerning for possible sleep apnea. She reports her mother did have problems with swelling in her legs as well but did not necessarily have varicose veins. She's never been diagnosed with varicose veins or treated for such she does have one child and is on her feet quite a lot.  She denies any chest pain with exertion but is not particularly physically active.  At her last office visit I recommended an echocardiogram and lower extremity venous Dopplers. She underwent those studies which showed essentially normal echocardiogram with normal systolic and diastolic function as well as normal right heart function. Her lower extremity Dopplers were negative for DVT and there was no evidence of either deep vein or superficial venous insufficiency.  She does report to fairly regular  dietary salt indiscretion.  01/02/2017  Whitney Foster returns for follow-up today. I last saw her in 2014 however recently she reestablish care with [name] and saw Whitney Ransom, PA-Foster. She was referred for an episode of bradycardia with heart rate in the 40s. She's also had fatigue and continues to have some lower extremity edema. She underwent a repeat echocardiogram which showed normal LV systolic function with EF 01-60%, grade 1 diastolic dysfunction and mild left atrial enlargement. Blood pressure is well-controlled today. In addition she wore monitor for one week which indicated mostly sinus rhythm and sinus tachycardia without any evidence for atrial fibrillation or significant bradycardia. She says she has difficulty falling asleep at night and reports that her sleep is very light and not restorative. She was referred for sleep study which she had to delay until June 8 but is very interested in getting that done.  PMHx:  Past Medical History:  Diagnosis Date  . Anemia    secondary to heavy menses- transfused in the past  . Fibroids    "2000"  . Hypertension   . Obesity    BMI 41    Past Surgical History:  Procedure Laterality Date  . CESAREAN SECTION     "1998"  . MYOMECTOMY ABDOMINAL APPROACH     1994  . MYOMECTOMY VAGINAL APPROACH     1997    FAMHx:  Family History  Problem Relation Age of Onset  . Heart disease Mother 44  . Heart disease Brother   . Heart disease Maternal Aunt     SOCHx:   reports  that she has never smoked. She has never used smokeless tobacco. She reports that she does not drink alcohol or use drugs.  ALLERGIES:  Allergies  Allergen Reactions  . Hydrocodone-Acetaminophen     Other reaction(s): GI Upset (intolerance)    ROS: Pertinent items noted in HPI and remainder of comprehensive ROS otherwise negative.  HOME MEDS: Current Outpatient Prescriptions  Medication Sig Dispense Refill  . aspirin 81 MG tablet Take 81 mg by mouth daily.    .  cetirizine (ZYRTEC) 10 MG tablet Take 10 mg by mouth daily.    . ferrous sulfate 325 (65 FE) MG tablet Take 325 mg by mouth daily.    Marland Kitchen ibuprofen (ADVIL,MOTRIN) 200 MG tablet Take 200 mg by mouth every 6 (six) hours as needed for pain.    Marland Kitchen losartan-hydrochlorothiazide (HYZAAR) 50-12.5 MG per tablet Take 1 tablet by mouth daily.     No current facility-administered medications for this visit.     LABS/IMAGING: No results found for this or any previous visit (from the past 48 hour(s)). No results found.  VITALS: BP 120/82 (BP Location: Left Arm, Patient Position: Sitting, Cuff Size: Large)   Pulse 92   Ht 5' 3.5" (1.613 m)   Wt 241 lb 3.2 oz (109.4 kg)   BMI 42.06 kg/m   EXAM: Deferred  EKG: deferred  ASSESSMENT: 1. Fatigue, excessive daytime sleepiness, nonrestorative sleep 2. Lower extremity edema - persistent, may be due to lymphedema 3. Dyspnea on exertion - EF 17-00%, mild diastolic dysfunction 4. Family history of premature cardiomyopathy 5. Morbid obesity  PLAN: 1.   Mrs. Whitney Foster continues to be fatigued with excessive daytime sleepiness and nonrestorative sleep and is morbidly obese at high risk for obstructive sleep apnea. She has a sleep study coming up next week. Echo was essentially unremarkable with some mild hypertensive changes. Her monitor failed to show any significant bradycardia. Plan follow-up with me in about 3 months hopefully she will have had a diagnosis as to whether or not she has sleep apnea at that time and been treated.  Whitney Casino, MD, [facility] Attending Cardiologist Whitney Foster Whitney Foster 01/02/2017, 9:29 AM

## 2017-01-10 ENCOUNTER — Ambulatory Visit (HOSPITAL_BASED_OUTPATIENT_CLINIC_OR_DEPARTMENT_OTHER): Payer: 59 | Attending: Cardiology | Admitting: Cardiovascular Disease

## 2017-01-10 VITALS — Ht 63.5 in | Wt 240.0 lb

## 2017-01-10 DIAGNOSIS — I10 Essential (primary) hypertension: Secondary | ICD-10-CM

## 2017-01-10 DIAGNOSIS — R5383 Other fatigue: Secondary | ICD-10-CM | POA: Diagnosis not present

## 2017-01-10 DIAGNOSIS — R0902 Hypoxemia: Secondary | ICD-10-CM | POA: Insufficient documentation

## 2017-01-10 DIAGNOSIS — G4733 Obstructive sleep apnea (adult) (pediatric): Secondary | ICD-10-CM | POA: Diagnosis not present

## 2017-01-10 DIAGNOSIS — G4700 Insomnia, unspecified: Secondary | ICD-10-CM | POA: Insufficient documentation

## 2017-01-10 DIAGNOSIS — Z72820 Sleep deprivation: Secondary | ICD-10-CM

## 2017-02-17 NOTE — Procedures (Signed)
Patient Name: Whitney Foster, Whitney Foster Date: 01/10/2017 Gender: Female D.O.B: [birth year] Age (years): 56 Referring Provider: Kerin Ransom Height (inches): 64 Interpreting Physician: Shelva Majestic MD, ABSM Weight (lbs): 240 RPSGT: Baxter Flattery BMI: 42 MRN: [id number] Neck Size: 15.50  CLINICAL INFORMATION Sleep Study Type: NPSG  Indication for sleep study: Fatigue, Hypertension, Obesity, Snoring  Epworth Sleepiness Score: 12  SLEEP STUDY TECHNIQUE As per the AASM Manual for the Scoring of Sleep and Associated Events v2.3 (April 2016) with a hypopnea requiring 4% desaturations.  The channels recorded and monitored were frontal, central and occipital EEG, electrooculogram (EOG), submentalis EMG (chin), nasal and oral airflow, thoracic and abdominal wall motion, anterior tibialis EMG, snore microphone, electrocardiogram, and pulse oximetry.  MEDICATIONS aspirin 81 MG tablet Take 81 mg by mouth daily. * cetirizine (ZYRTEC) 10 MG tablet Take 10 mg by mouth daily.   * ferrous sulfate 325 (65 FE) MG tablet Take 325 mg by mouth daily.   * ibuprofen (ADVIL,MOTRIN) 200 MG tablet Take 200 mg by mouth every 6 (six) hours as needed for pain.   * losartan-hydrochlorothiazide (HYZAAR) 50-12.5 MG per tablet Take 1 tablet by mouth daily.    Medications self-administered by patient taken the night of the study : [place] The study was initiated at 9:58:50 PM and ended at 4:29:05 AM.  Sleep onset time was 10.5 minutes and the sleep efficiency was 81.7%. The total sleep time was 319.0 minutes. Wake after sleep onset (WASO) was 60.7 minutes  Stage REM latency was 57.5 minutes.  The patient spent 4.39% of the night in stage N1 sleep, 65.99% in stage N2 sleep, 0.00% in stage N3 and 29.62% in REM.  Alpha intrusion was absent.  Supine sleep was 0.00%.  RESPIRATORY PARAMETERS The overall apnea/hypopnea index (AHI) was 27.6 per hour. There were 101 total apneas,  including 101 obstructive, 0 central and 0 mixed apneas. There were 46 hypopneas and 4 RERAs.  The AHI during Stage REM sleep was 54.6 per hour.  AHI while supine was N/A per hour.  The mean oxygen saturation was 94.39%. The minimum SpO2 during sleep was 79.00%.  Loud snoring was noted during this study.  CARDIAC DATA The 2 lead EKG demonstrated sinus rhythm. The mean heart rate was 82.77 beats per minute. Other EKG findings include: None.  LEG MOVEMENT DATA The total PLMS were 0 with a resulting PLMS index of 0.00. Associated arousal with leg movement index was 0.0 .  IMPRESSIONS - Moderate obstructive sleep apnea overall (AHI 27.6/h); however, severe sleep apnea during REM sleep (AHI 54.0/h) - No significant central sleep apnea occurred during this study (CAI = 0.0/h). - Significant oxygen desaturation to a nadir of 79%. - The patient snored with Loud snoring volume. - No cardiac abnormalities were noted during this study. - Clinically significant periodic limb movements did not occur during sleep. No significant associated arousals.  DIAGNOSIS - Obstructive Sleep Apnea (327.23 [G47.33 ICD-10]) - Nocturnal Hypoxemia (327.26 [G47.36 ICD-10])  RECOMMENDATIONS - Recommend therapeutic CPAP titration to determine optimal pressure required to alleviate sleep disordered breathing. - Efforts should be made to optimize nasal and oropharyngeal patency. - Avoid alcohol, sedatives and other CNS depressants that may worsen sleep apnea and disrupt normal sleep architecture. - Sleep hygiene should be reviewed to assess factors that may improve sleep quality. - Weight management (BMI 42) and regular exercise should be initiated.  [Electronically signed] 02/17/2017 10:44 PM  Shelva Majestic MD, [facility], ABSM Diplomate, American Board of Sleep  Medicine   NPI: [id number] [place] PH: [phone]   FX: [phone] [place]

## 2017-02-18 ENCOUNTER — Telehealth: Payer: Self-pay | Admitting: *Deleted

## 2017-02-18 DIAGNOSIS — G4733 Obstructive sleep apnea (adult) (pediatric): Secondary | ICD-10-CM

## 2017-02-18 NOTE — Telephone Encounter (Addendum)
-----   Message from Troy Sine, MD sent at 02/17/2017 10:49 PM EDT ----- Mariann Laster please schedule for CPAP titration study   Left message for pt to call, CPAP titration is scheduled for 03-31-17 @ 8 pm at [facility] sleep [facility].

## 2017-02-18 NOTE — Telephone Encounter (Signed)
F/u Message ° °Pt returning RN call. Please call back to discuss  °

## 2017-02-18 NOTE — Telephone Encounter (Signed)
Spoke with pt, aware of sleep study results and next appointment.

## 2017-02-25 ENCOUNTER — Telehealth: Payer: Self-pay | Admitting: *Deleted

## 2017-02-25 NOTE — Telephone Encounter (Signed)
Called patient to give her sleep study results and recommendations. She states that she has already been given the results. CPAP titration study has been scheduled.

## 2017-02-25 NOTE — Progress Notes (Signed)
Patient was notified of results by Lynnda Child when I was on vacation.

## 2017-02-25 NOTE — Telephone Encounter (Signed)
-----   Message from Troy Sine, MD sent at 02/17/2017 10:49 PM EDT ----- Mariann Laster please schedule for CPAP titration study

## 2017-03-21 DIAGNOSIS — G5601 Carpal tunnel syndrome, right upper limb: Secondary | ICD-10-CM | POA: Diagnosis not present

## 2017-03-28 DIAGNOSIS — Z0000 Encounter for general adult medical examination without abnormal findings: Secondary | ICD-10-CM | POA: Diagnosis not present

## 2017-03-28 DIAGNOSIS — Z136 Encounter for screening for cardiovascular disorders: Secondary | ICD-10-CM | POA: Diagnosis not present

## 2017-03-31 ENCOUNTER — Encounter (HOSPITAL_BASED_OUTPATIENT_CLINIC_OR_DEPARTMENT_OTHER): Payer: 59

## 2017-04-03 ENCOUNTER — Ambulatory Visit: Payer: 59 | Admitting: Internal Medicine

## 2017-04-30 DIAGNOSIS — Z1231 Encounter for screening mammogram for malignant neoplasm of breast: Secondary | ICD-10-CM | POA: Diagnosis not present

## 2017-04-30 DIAGNOSIS — Z01411 Encounter for gynecological examination (general) (routine) with abnormal findings: Secondary | ICD-10-CM | POA: Diagnosis not present

## 2017-05-15 DIAGNOSIS — M1811 Unilateral primary osteoarthritis of first carpometacarpal joint, right hand: Secondary | ICD-10-CM | POA: Diagnosis not present

## 2017-05-23 ENCOUNTER — Encounter (HOSPITAL_BASED_OUTPATIENT_CLINIC_OR_DEPARTMENT_OTHER): Payer: 59

## 2017-07-01 ENCOUNTER — Other Ambulatory Visit: Payer: Self-pay | Admitting: Sports Medicine

## 2017-07-01 DIAGNOSIS — M545 Low back pain: Secondary | ICD-10-CM

## 2017-07-15 ENCOUNTER — Other Ambulatory Visit: Payer: 59

## 2017-07-25 ENCOUNTER — Ambulatory Visit
Admission: RE | Admit: 2017-07-25 | Discharge: 2017-07-25 | Disposition: A | Discharge status: Home / Self Care | Payer: 59 | Source: Ambulatory Visit | Attending: Sports Medicine | Admitting: Sports Medicine

## 2017-07-25 ENCOUNTER — Other Ambulatory Visit: Payer: 59

## 2017-07-25 DIAGNOSIS — M545 Low back pain: Secondary | ICD-10-CM

## 2017-08-12 DIAGNOSIS — M545 Low back pain: Secondary | ICD-10-CM | POA: Diagnosis not present

## 2017-08-22 ENCOUNTER — Ambulatory Visit (HOSPITAL_BASED_OUTPATIENT_CLINIC_OR_DEPARTMENT_OTHER): Payer: 59

## 2017-09-24 DIAGNOSIS — H10413 Chronic giant papillary conjunctivitis, bilateral: Secondary | ICD-10-CM | POA: Diagnosis not present

## 2017-10-01 ENCOUNTER — Encounter (HOSPITAL_BASED_OUTPATIENT_CLINIC_OR_DEPARTMENT_OTHER): Payer: 59

## 2017-10-17 ENCOUNTER — Ambulatory Visit (HOSPITAL_BASED_OUTPATIENT_CLINIC_OR_DEPARTMENT_OTHER): Payer: 59 | Attending: Cardiovascular Disease | Admitting: Cardiovascular Disease

## 2017-10-17 VITALS — Ht 63.0 in | Wt 240.0 lb

## 2017-10-17 DIAGNOSIS — G4733 Obstructive sleep apnea (adult) (pediatric): Secondary | ICD-10-CM | POA: Diagnosis not present

## 2017-10-30 DIAGNOSIS — H10419 Chronic giant papillary conjunctivitis, unspecified eye: Secondary | ICD-10-CM | POA: Diagnosis not present

## 2017-11-04 ENCOUNTER — Encounter (HOSPITAL_BASED_OUTPATIENT_CLINIC_OR_DEPARTMENT_OTHER): Payer: Self-pay | Admitting: Cardiovascular Disease

## 2017-11-04 NOTE — Procedures (Signed)
Patient Name: Whitney Foster, Whitney Foster Date: 10/17/2017 Gender: Female D.O.B: 1961/07/01 Age (years): 61 Referring Provider: Shelva Majestic MD, ABSM Height (inches): 64 Interpreting Physician: Shelva Majestic MD, ABSM Weight (lbs): 240 RPSGT: Earney [place] BMI: 42 MRN: [id number] Neck Size: 14.00  CLINICAL INFORMATION The patient is referred for a CPAP titration to treat sleep apnea.  Date of NPSG:  01/10/2017: AHI 27.6/h; RDI 28.4/h: REM AHI 54.6/h; oxygen desaturation to 79%.  SLEEP STUDY TECHNIQUE As per the AASM Manual for the Scoring of Sleep and Associated Events v2.3 (April 2016) with a hypopnea requiring 4% desaturations.  The channels recorded and monitored were frontal, central and occipital EEG, electrooculogram (EOG), submentalis EMG (chin), nasal and oral airflow, thoracic and abdominal wall motion, anterior tibialis EMG, snore microphone, electrocardiogram, and pulse oximetry. Continuous positive airway pressure (CPAP) was initiated at the beginning of the study and titrated to treat sleep-disordered breathing.  MEDICATIONS     aspirin 81 MG tablet         cetirizine (ZYRTEC) 10 MG tablet         ferrous sulfate 325 (65 FE) MG tablet         ibuprofen (ADVIL,MOTRIN) 200 MG tablet         losartan-hydrochlorothiazide (HYZAAR) 50-12.5 MG per tablet     ??????? Medications self-administered by patient taken the night of the study : [place] Comments added by technician: NONE  RESPIRATORY PARAMETERS Optimal PAP Pressure (cm): 10 AHI at Optimal Pressure (/hr): 0.0 Overall Minimal O2 (%): 0.0 (not accurate) Supine % at Optimal Pressure (%): 29 Minimal O2 at Optimal Pressure (%): 91.0   SLEEP ARCHITECTURE The study was initiated at 11:04:48 PM and ended at 5:06:48 AM.  Sleep onset time was 12.4 minutes and the sleep efficiency was 92.8%%. The total sleep time was 336.1 minutes.  The patient spent 1.6%% of the night in stage N1 sleep,  75.8%% in stage N2 sleep, 0.0%% in stage N3 and 22.61% in REM.Stage REM latency was 138.5 minutes  Wake after sleep onset was 13.5. Alpha intrusion was absent. Supine sleep was 39.42%.  CARDIAC DATA The 2 lead EKG demonstrated sinus rhythm. The mean heart rate was 81.5 beats per minute. Other EKG findings include: None.  LEG MOVEMENT DATA The total Periodic Limb Movements of Sleep (PLMS) were 0. The PLMS index was 0.0. A PLMS index of <15 is considered normal in adults.  IMPRESSIONS - The optimal PAP pressure was 10 cm of water. - Central sleep apnea was not noted during this titration (CAI = 0.0/h). - Patient snored with soft snoring volume during this titration study. - No cardiac abnormalities were observed during this study. - Clinically significant periodic limb movements were not noted during this study. Arousals associated with PLMs were rare.  DIAGNOSIS - Obstructive Sleep Apnea (327.23 [G47.33 ICD-10])  RECOMMENDATIONS - Recommend an initial trial of CPAP therapy with EPR of 3 at 10 cm H2O with heated humidification. A Small size Fisher&Paykel Full Face Mask Simplus mask was used for the titration. - Efforts should be made to optimize nasal and oropharyngeal patency. - Avoid alcohol, sedatives and other CNS depressants that may worsen sleep apnea and disrupt normal sleep architecture. - Sleep hygiene should be reviewed to assess factors that may improve sleep quality. - Weight management and regular exercise should be initiated or continued. - Recommend a downliad be obtained in 30 days and sleep clinic evaluation after 4 weeks of therapy. -   [  Electronically signed] 11/04/2017 11:22 AM  Shelva Majestic MD, [facility] [employer], ABSM Diplomate, American Board of Sleep Medicine   NPI: [id number] [place] PH: [phone]   FX: [phone] [place]

## 2017-11-04 NOTE — Progress Notes (Signed)
Left message to return a call. 

## 2017-11-05 NOTE — Progress Notes (Signed)
Referral sent to [facility].

## 2017-11-06 ENCOUNTER — Telehealth: Payer: Self-pay | Admitting: *Deleted

## 2017-11-06 NOTE — Telephone Encounter (Signed)
-----   Message from Troy Sine, MD sent at 11/04/2017 11:31 AM EDT ----- Mariann Laster, please notify the patient of the results of her CPAP titration and set up with DME company for CPAP set up.  Arrange for follow-up sleep clinic evaluation.

## 2017-11-06 NOTE — Telephone Encounter (Signed)
Patient informed CPAP referral has been sent to [facility]. She asked how much this will cost. I explained to her that [facility] will obtain benefits from her insurance company and go over it with her when they contact her for set up.

## 2017-11-12 DIAGNOSIS — M67441 Ganglion, right hand: Secondary | ICD-10-CM | POA: Diagnosis not present

## 2017-11-12 DIAGNOSIS — M65311 Trigger thumb, right thumb: Secondary | ICD-10-CM | POA: Diagnosis not present

## 2017-12-17 ENCOUNTER — Telehealth: Payer: Self-pay | Admitting: *Deleted

## 2017-12-17 NOTE — Telephone Encounter (Signed)
Called patient to inform her that I have received notification from [facility] that they have been unable to reach her to set up her CPAP machine. She states that she did speak with someone and was told that her portion of the bill would be approx $190. She states that she told them she did not have the money at that time but would contact them back when she got paid. ( which will be this Friday). She admits that she kept "forgetting" to call them for set up and did not "save" their phone number in her phone. I gave her the contact information today. Patient states that she will contact them today.

## 2017-12-23 DIAGNOSIS — M65311 Trigger thumb, right thumb: Secondary | ICD-10-CM | POA: Diagnosis not present

## 2018-01-01 DIAGNOSIS — Z30432 Encounter for removal of intrauterine contraceptive device: Secondary | ICD-10-CM | POA: Diagnosis not present

## 2018-01-09 DIAGNOSIS — G4733 Obstructive sleep apnea (adult) (pediatric): Secondary | ICD-10-CM | POA: Diagnosis not present

## 2018-01-12 ENCOUNTER — Telehealth: Payer: Self-pay | Admitting: Cardiovascular Disease

## 2018-01-12 NOTE — Telephone Encounter (Signed)
New Message:     Pt called for an appointment.She just started on a new C-pap machine. When does she need to see Dr Claiborne Billings? First opening for Sleep Clinic I saw was in September.

## 2018-01-12 NOTE — Telephone Encounter (Signed)
Whitney Foster with [facility] will let us know when she needs to be seen. Then we will contact her for a appointment.

## 2018-01-13 ENCOUNTER — Telehealth: Payer: Self-pay | Admitting: *Deleted

## 2018-01-13 NOTE — Telephone Encounter (Signed)
Follow Up:    Do I need to let this pt know that we will call her as soon as Anderson Malta calls [name]?:

## 2018-01-13 NOTE — Telephone Encounter (Signed)
Faxed order foe CPAP and supplies to [facility].

## 2018-01-14 NOTE — Telephone Encounter (Signed)
No Ma'am. I will take care of it. Thanks

## 2018-01-22 ENCOUNTER — Ambulatory Visit: Payer: 59 | Admitting: Cardiovascular Disease

## 2018-02-08 DIAGNOSIS — G4733 Obstructive sleep apnea (adult) (pediatric): Secondary | ICD-10-CM | POA: Diagnosis not present

## 2018-03-11 DIAGNOSIS — G4733 Obstructive sleep apnea (adult) (pediatric): Secondary | ICD-10-CM | POA: Diagnosis not present

## 2018-04-11 DIAGNOSIS — G4733 Obstructive sleep apnea (adult) (pediatric): Secondary | ICD-10-CM | POA: Diagnosis not present

## 2018-05-01 ENCOUNTER — Telehealth: Payer: Self-pay | Admitting: Cardiovascular Disease

## 2018-05-01 NOTE — Telephone Encounter (Signed)
New Message   Patient is calling because she needs to cancel the appt on 9/30 but its for CPAP compliance. Dr. Claiborne Billings does not have anything available until 08/2018. Please call patient to see if she can come in sooner

## 2018-05-01 NOTE — Telephone Encounter (Signed)
Please advise if anything sooner? Thanks!

## 2018-05-01 NOTE — Telephone Encounter (Signed)
Left message that per Ivin Booty @ [facility] for insurance purposes they only need a compliant DL which they have. If she plans not to be here on Monday 05/04/09 call back and cancel the appointment and reschedule for his  first available.

## 2018-05-04 ENCOUNTER — Ambulatory Visit (INDEPENDENT_AMBULATORY_CARE_PROVIDER_SITE_OTHER): Payer: 59 | Admitting: Cardiovascular Disease

## 2018-05-04 VITALS — BP 132/84 | HR 82 | Ht 63.5 in | Wt 248.4 lb

## 2018-05-04 DIAGNOSIS — G4733 Obstructive sleep apnea (adult) (pediatric): Secondary | ICD-10-CM

## 2018-05-04 DIAGNOSIS — I10 Essential (primary) hypertension: Secondary | ICD-10-CM | POA: Diagnosis not present

## 2018-05-04 NOTE — Progress Notes (Signed)
Cardiology Office Note    Date:  05/06/2018   ID:  Whitney Foster, DOB 10/19/1960, MRN [id number]  PCP:  Seward Carol, MD  Cardiologist:  Shelva Majestic, MD   New sleep evaluation.  History of Present Illness:  Whitney Foster is a 57 y.o. female who presents for initial evaluation with me following initiation of CPAP therapy.  Whitney Foster is followed by Dr. Debara Pickett.  She has a history of hypertension as well as lower extremity edema.  She was seen earlier this year by [facility] and had complaints of increased daytime fatigue, weakness, poor sleep, and snoring.  She had previously undergone a diagnostic polysomnogram in June 2018 which revealed moderate obstructive sleep apnea overall with an AHI of 27.6/h.  However she had severe sleep apnea during REM sleep with an AHI of 54/h.  There was significant oxygen desaturation to a nadir of 79% and there was evidence for loud snoring.  Apparently she had never followed up with CPAP titration until March 2019.  He was titrated up to 10 cm water pressure and had an excellent response with an AHI at 0 and minimum oxygen at optimal pressure 91%.  She initiated CPAP on January 07, 2018.  [facility] is her DME company.  A download was obtained in the office today from August 31 through May 03, 2018.  Usage days was 73% with usage greater than 4 hours 70%, and she is just meeting compliance standards.  Average usage on days used was 6 hours and 8 minutes.  Typically she often goes to bed around 1130 and wakes up around 6:30 AM.  At 10 cm water pressure AHI was excellent at 2.2.  She is sleeping much better since initiating CPAP therapy.  She does admit to a dry throat as result of treatment.  She presents for evaluation.   Past Medical History:  Diagnosis Date  . Anemia    secondary to heavy menses- transfused in the past  . Fibroids    "2000"  . Hypertension   . Obesity    BMI 41    Past Surgical History:  Procedure Laterality Date    . CESAREAN SECTION     "1998"  . MYOMECTOMY ABDOMINAL APPROACH     1994  . MYOMECTOMY VAGINAL APPROACH     1997    Current Medications: Outpatient Medications Prior to Visit  Medication Sig Dispense Refill  . cetirizine (ZYRTEC) 10 MG tablet Take 10 mg by mouth daily.    Marland Kitchen ibuprofen (ADVIL,MOTRIN) 200 MG tablet Take 200 mg by mouth every 6 (six) hours as needed for pain.    Marland Kitchen losartan-hydrochlorothiazide (HYZAAR) 50-12.5 MG per tablet Take 1 tablet by mouth daily.    Marland Kitchen aspirin 81 MG tablet Take 81 mg by mouth daily.    . ferrous sulfate 325 (65 FE) MG tablet Take 325 mg by mouth daily.     No facility-administered medications prior to visit.      Allergies:   Hydrocodone-acetaminophen   Social History   Socioeconomic History  . Marital status: Married    Spouse name: Not on file  . Number of children: Not on file  . Years of education: Not on file  . Highest education level: Not on file  Occupational History  . Not on file  Social Needs  . Financial resource strain: Not on file  . Food insecurity:    Worry: Not on file    Inability: Not on file  .  Transportation needs:    Medical: Not on file    Non-medical: Not on file  Tobacco Use  . Smoking status: Never Smoker  . Smokeless tobacco: Never Used  Substance and Sexual Activity  . Alcohol use: No  . Drug use: No  . Sexual activity: Yes    Birth control/protection: None  Lifestyle  . Physical activity:    Days per week: Not on file    Minutes per session: Not on file  . Stress: Not on file  Relationships  . Social connections:    Talks on phone: Not on file    Gets together: Not on file    Attends religious service: Not on file    Active member of club or organization: Not on file    Attends meetings of clubs or organizations: Not on file    Relationship status: Not on file  Other Topics Concern  . Not on file  Social History Narrative  . Not on file    Socially she works for the [employer].   She is sedentary.  Family History:  The patient's family history includes Heart disease in her brother and maternal aunt; Heart disease (age of onset: 69) in her mother.   Her mother is 34 and has a history of heart failure and ICD.  Father is 32.  She is married for 24 years and has 1 child  ROS General: Negative; No fevers, chills, or night sweats;  HEENT: Negative; No changes in vision or hearing, sinus congestion, difficulty swallowing Pulmonary: Negative; No cough, wheezing, shortness of breath, hemoptysis Cardiovascular: History of hypertension.  No chest tightness or palpitations.  No presyncope GI: Negative; No nausea, vomiting, diarrhea, or abdominal pain GU: Negative; No dysuria, hematuria, or difficulty voiding Musculoskeletal: Negative; no myalgias, joint pain, or weakness Hematologic/Oncology: Negative; no easy bruising, bleeding Endocrine: Negative; no heat/cold intolerance; no diabetes Neuro: Negative; no changes in balance, headaches Skin: Negative; No rashes or skin lesions Psychiatric: Negative; No behavioral problems, depression Sleep: Positive for OSI, recently started on CPAP.  No residual snoring.  Daytime sleepiness is improved; no bruxism, restless legs, hypnogognic hallucinations, no cataplexy Other comprehensive 14 point system review is negative.   PHYSICAL EXAM:   VS:  BP 132/84   Pulse 82   Ht 5' 3.5" (1.613 m)   Wt 248 lb 6.4 oz (112.7 kg)   BMI 43.31 kg/m     Repeat blood pressure by me was 112/78 Wt Readings from Last 3 Encounters:  05/04/18 248 lb 6.4 oz (112.7 kg)  10/17/17 240 lb (108.9 kg)  01/10/17 240 lb (108.9 kg)    General: Alert, oriented, no distress.  Skin: normal turgor, no rashes, warm and dry HEENT: Normocephalic, atraumatic. Pupils equal round and reactive to light; sclera anicteric; extraocular muscles intact; Fundi disks flat, without hemorrhages or exudates. Nose without nasal septal hypertrophy Mouth/Parynx benign;  Mallinpatti scale3 Neck: No JVD, no carotid bruits; normal carotid upstroke Lungs: clear to ausculatation and percussion; no wheezing or rales Chest wall: without tenderness to palpitation Heart: PMI not displaced, RRR, s1 s2 normal, 1/6 systolic murmur, no diastolic murmur, no rubs, gallops, thrills, or heaves Abdomen: Central adiposity soft, nontender; no hepatosplenomehaly, BS+; abdominal aorta nontender and not dilated by palpation. Back: no CVA tenderness Pulses 2+ Musculoskeletal: full range of motion, normal strength, no joint deformities Extremities: no clubbing cyanosis or edema, Homan's sign negative  Neurologic: grossly nonfocal; Cranial nerves grossly wnl Psychologic: Normal mood and affect   Studies/Labs Reviewed:  EKG:  EKG is ordered today.  ECG (independently read by me): Normal sinus rhythm at 82 bpm.  No ectopy.  Normal intervals.  Recent Labs: BMP Latest Ref Rng & Units 10/08/2012  Glucose 70 - 99 mg/dL 89  BUN 6 - 23 mg/dL 8  Creatinine 0.50 - 1.10 mg/dL 0.74  Sodium 135 - 145 mEq/L 139  Potassium 3.5 - 5.1 mEq/L 3.9  Chloride 96 - 112 mEq/L 107  CO2 19 - 32 mEq/L 24  Calcium 8.4 - 10.5 mg/dL 8.8     Hepatic Function Latest Ref Rng & Units 10/08/2012  Total Protein 6.0 - 8.3 g/dL 6.4  Albumin 3.5 - 5.2 g/dL 3.2(L)  AST 0 - 37 U/L 11  ALT 0 - 35 U/L 8  Alk Phosphatase 39 - 117 U/L 76  Total Bilirubin 0.3 - 1.2 mg/dL 0.2(L)    CBC Latest Ref Rng & Units 10/09/2012 10/08/2012  WBC 4.0 - 10.5 K/uL 7.2 7.2  Hemoglobin 12.0 - 15.0 g/dL 9.5(L) 7.6(L)  Hematocrit 36.0 - 46.0 % 30.4(L) 26.1(L)  Platelets 150 - 400 K/uL 334 377   Lab Results  Component Value Date   MCV 67.3 (L) 10/09/2012   MCV 64.0 (L) 10/08/2012   No results found for: TSH No results found for: HGBA1C   BNP No results found for: BNP  ProBNP    Component Value Date/Time   PROBNP 14.2 10/08/2012 1335     Lipid Panel  No results found for: CHOL, TRIG, HDL, CHOLHDL, VLDL, LDLCALC,  LDLDIRECT   RADIOLOGY: No results found.   Additional studies/ records that were reviewed today include:  I reviewed the patient's sleep study, CPAP titration trials, and obtained a download since initiating CPAP therapy.   ASSESSMENT:    1. OSA (obstructive sleep apnea)   2. Morbid obesity ([place])   3. Essential hypertension      PLAN:  Ms. Lundy is a pleasant 57 year old female who has a history of hypertension, morbid obesity with a BMI of 43.3, history of prior lower extremity edema, who was found to have moderate overall sleep apnea which is severe during rem sleep.  I reviewed her sleep studies with her in detail.  She is now on CPAP therapy since January 07, 2018.  I reviewed her download.  She is just barely meeting medical compliance.  I discussed with her the importance of sleeping with CPAP for at least 7 8 hours per night if at all possible.  On therapy, AHI is excellent at 2.2 and there is no significant leak.  We discussed adjusting humidification to help with her throat dryness.  I also discussed nasal saline.  I discussed normal sleep architecture and the effects of sleep apnea leading to abnormal sleep architecture, disruptive sleep, decreased rem sleep.  I also discussed the cardiovascular effects of untreated sleep apnea in detail.  I discussed the importance of weight loss and exercise.  She is doing well with current therapy.  As long as she remains stable I will see her on an as-needed basis from a sleep perspective or sooner if problems arise.   Medication Adjustments/Labs and Tests Ordered: Current medicines are reviewed at length with the patient today.  Concerns regarding medicines are outlined above.  Medication changes, Labs and Tests ordered today are listed in the Patient Instructions below. Patient Instructions  Medication Instructions:  Your physician recommends that you continue on your current medications as directed. Please refer to the Current Medication list  given to you  today.  Follow-Up: As needed with Dr. Claiborne Billings      Signed, Shelva Majestic, MD  05/06/2018 6:05 PM    [facility] [address] [place], [place], [place]  [address] Phone: [phone]

## 2018-05-04 NOTE — Patient Instructions (Signed)
Medication Instructions:  Your physician recommends that you continue on your current medications as directed. Please refer to the Current Medication list given to you today.  Follow-Up: As needed with Dr. Claiborne Billings

## 2018-05-06 ENCOUNTER — Encounter: Payer: Self-pay | Admitting: Cardiovascular Disease

## 2018-05-11 DIAGNOSIS — G4733 Obstructive sleep apnea (adult) (pediatric): Secondary | ICD-10-CM | POA: Diagnosis not present

## 2018-05-12 DIAGNOSIS — I10 Essential (primary) hypertension: Secondary | ICD-10-CM | POA: Diagnosis not present

## 2018-05-12 DIAGNOSIS — Z0000 Encounter for general adult medical examination without abnormal findings: Secondary | ICD-10-CM | POA: Diagnosis not present

## 2018-05-12 DIAGNOSIS — G4733 Obstructive sleep apnea (adult) (pediatric): Secondary | ICD-10-CM | POA: Diagnosis not present

## 2018-05-12 DIAGNOSIS — D509 Iron deficiency anemia, unspecified: Secondary | ICD-10-CM | POA: Diagnosis not present

## 2018-05-12 DIAGNOSIS — Z8249 Family history of ischemic heart disease and other diseases of the circulatory system: Secondary | ICD-10-CM | POA: Diagnosis not present

## 2018-05-12 DIAGNOSIS — R600 Localized edema: Secondary | ICD-10-CM | POA: Diagnosis not present

## 2018-05-13 ENCOUNTER — Other Ambulatory Visit (HOSPITAL_COMMUNITY): Payer: 59

## 2018-05-13 ENCOUNTER — Other Ambulatory Visit (HOSPITAL_COMMUNITY): Payer: Self-pay | Admitting: Internal Medicine

## 2018-05-13 DIAGNOSIS — R600 Localized edema: Secondary | ICD-10-CM

## 2018-05-19 ENCOUNTER — Ambulatory Visit (HOSPITAL_COMMUNITY): Payer: 59

## 2018-06-08 DIAGNOSIS — Z124 Encounter for screening for malignant neoplasm of cervix: Secondary | ICD-10-CM | POA: Diagnosis not present

## 2018-06-08 DIAGNOSIS — Z01411 Encounter for gynecological examination (general) (routine) with abnormal findings: Secondary | ICD-10-CM | POA: Diagnosis not present

## 2018-06-08 DIAGNOSIS — D259 Leiomyoma of uterus, unspecified: Secondary | ICD-10-CM | POA: Diagnosis not present

## 2018-06-11 DIAGNOSIS — G4733 Obstructive sleep apnea (adult) (pediatric): Secondary | ICD-10-CM | POA: Diagnosis not present

## 2018-06-11 DIAGNOSIS — Z23 Encounter for immunization: Secondary | ICD-10-CM | POA: Diagnosis not present

## 2018-06-18 DIAGNOSIS — Z1231 Encounter for screening mammogram for malignant neoplasm of breast: Secondary | ICD-10-CM | POA: Diagnosis not present

## 2018-06-23 ENCOUNTER — Other Ambulatory Visit: Payer: Self-pay

## 2018-06-23 ENCOUNTER — Ambulatory Visit (HOSPITAL_COMMUNITY): Payer: 59 | Attending: Cardiology

## 2018-06-23 DIAGNOSIS — R600 Localized edema: Secondary | ICD-10-CM | POA: Insufficient documentation

## 2018-07-11 DIAGNOSIS — G4733 Obstructive sleep apnea (adult) (pediatric): Secondary | ICD-10-CM | POA: Diagnosis not present

## 2018-07-23 DIAGNOSIS — M25552 Pain in left hip: Secondary | ICD-10-CM | POA: Diagnosis not present

## 2018-07-23 DIAGNOSIS — M545 Low back pain: Secondary | ICD-10-CM | POA: Diagnosis not present

## 2018-07-23 DIAGNOSIS — M25551 Pain in right hip: Secondary | ICD-10-CM | POA: Diagnosis not present

## 2018-10-22 DIAGNOSIS — L219 Seborrheic dermatitis, unspecified: Secondary | ICD-10-CM | POA: Diagnosis not present

## 2018-10-22 DIAGNOSIS — L658 Other specified nonscarring hair loss: Secondary | ICD-10-CM | POA: Insufficient documentation

## 2018-12-10 DIAGNOSIS — L659 Nonscarring hair loss, unspecified: Secondary | ICD-10-CM | POA: Insufficient documentation

## 2018-12-10 DIAGNOSIS — N951 Menopausal and female climacteric states: Secondary | ICD-10-CM | POA: Diagnosis not present

## 2019-07-15 DIAGNOSIS — R3129 Other microscopic hematuria: Secondary | ICD-10-CM | POA: Insufficient documentation

## 2019-07-21 ENCOUNTER — Other Ambulatory Visit: Payer: Self-pay | Admitting: Obstetrics and Gynecology

## 2019-07-21 DIAGNOSIS — N6489 Other specified disorders of breast: Secondary | ICD-10-CM

## 2019-08-02 ENCOUNTER — Other Ambulatory Visit: Payer: Self-pay | Admitting: Obstetrics and Gynecology

## 2019-08-02 ENCOUNTER — Ambulatory Visit
Admission: RE | Admit: 2019-08-02 | Discharge: 2019-08-02 | Disposition: A | Discharge status: Home / Self Care | Payer: 59 | Source: Ambulatory Visit | Attending: Obstetrics and Gynecology | Admitting: Obstetrics and Gynecology

## 2019-08-02 ENCOUNTER — Other Ambulatory Visit: Payer: Self-pay

## 2019-08-02 DIAGNOSIS — N6489 Other specified disorders of breast: Secondary | ICD-10-CM

## 2019-08-02 DIAGNOSIS — N6320 Unspecified lump in the left breast, unspecified quadrant: Secondary | ICD-10-CM

## 2019-08-03 ENCOUNTER — Inpatient Hospital Stay: Admission: RE | Admit: 2019-08-03 | Payer: 59 | Source: Ambulatory Visit

## 2019-08-03 DIAGNOSIS — N6320 Unspecified lump in the left breast, unspecified quadrant: Secondary | ICD-10-CM | POA: Insufficient documentation

## 2019-08-13 ENCOUNTER — Ambulatory Visit
Admission: RE | Admit: 2019-08-13 | Discharge: 2019-08-13 | Disposition: A | Discharge status: Home / Self Care | Payer: 59 | Source: Ambulatory Visit | Attending: Obstetrics and Gynecology | Admitting: Obstetrics and Gynecology

## 2019-08-13 ENCOUNTER — Other Ambulatory Visit: Payer: Self-pay

## 2019-08-13 DIAGNOSIS — N6320 Unspecified lump in the left breast, unspecified quadrant: Secondary | ICD-10-CM

## 2019-08-16 ENCOUNTER — Other Ambulatory Visit: Payer: 59

## 2020-08-15 ENCOUNTER — Ambulatory Visit (INDEPENDENT_AMBULATORY_CARE_PROVIDER_SITE_OTHER): Payer: Self-pay | Admitting: Family Medicine

## 2020-08-29 ENCOUNTER — Ambulatory Visit (INDEPENDENT_AMBULATORY_CARE_PROVIDER_SITE_OTHER): Payer: Self-pay | Admitting: Family Medicine

## 2020-10-03 ENCOUNTER — Other Ambulatory Visit: Payer: Self-pay | Admitting: Obstetrics and Gynecology

## 2020-10-03 DIAGNOSIS — Z1231 Encounter for screening mammogram for malignant neoplasm of breast: Secondary | ICD-10-CM

## 2020-10-05 ENCOUNTER — Ambulatory Visit
Admission: RE | Admit: 2020-10-05 | Discharge: 2020-10-05 | Disposition: A | Discharge status: Home / Self Care | Payer: 59 | Source: Ambulatory Visit | Attending: Obstetrics and Gynecology | Admitting: Obstetrics and Gynecology

## 2020-10-05 ENCOUNTER — Other Ambulatory Visit: Payer: Self-pay

## 2020-10-05 DIAGNOSIS — Z1231 Encounter for screening mammogram for malignant neoplasm of breast: Secondary | ICD-10-CM

## 2020-12-05 ENCOUNTER — Other Ambulatory Visit: Payer: Self-pay

## 2020-12-05 ENCOUNTER — Encounter: Payer: Self-pay | Admitting: Cardiovascular Disease

## 2020-12-05 ENCOUNTER — Ambulatory Visit: Payer: 59 | Admitting: Cardiovascular Disease

## 2020-12-05 VITALS — BP 140/84 | HR 77 | Ht 62.0 in | Wt 218.0 lb

## 2020-12-05 DIAGNOSIS — R0600 Dyspnea, unspecified: Secondary | ICD-10-CM | POA: Diagnosis not present

## 2020-12-05 DIAGNOSIS — G4733 Obstructive sleep apnea (adult) (pediatric): Secondary | ICD-10-CM | POA: Diagnosis not present

## 2020-12-05 DIAGNOSIS — R0609 Other forms of dyspnea: Secondary | ICD-10-CM

## 2020-12-05 DIAGNOSIS — R0789 Other chest pain: Secondary | ICD-10-CM

## 2020-12-05 DIAGNOSIS — R600 Localized edema: Secondary | ICD-10-CM

## 2020-12-05 DIAGNOSIS — I10 Essential (primary) hypertension: Secondary | ICD-10-CM

## 2020-12-05 NOTE — Patient Instructions (Signed)
Medication Instructions:  No Changes In Medications at this time.  *If you need a refill on your cardiac medications before your next appointment, please call your pharmacy*  Testing/Procedures: Dr. Gwenlyn Found has ordered a CT coronary calcium score. This test is done at [address]. This is $99 out of pocket.  Coronary CalciumScan A coronary calcium scan is an imaging test used to look for deposits of calcium and other fatty materials (plaques) in the inner lining of the blood vessels of the heart (coronary arteries). These deposits of calcium and plaques can partly clog and narrow the coronary arteries without producing any symptoms or warning signs. This puts a person at risk for a heart attack. This test can detect these deposits before symptoms develop. Tell a health care provider about:  Any allergies you have.  All medicines you are taking, including vitamins, herbs, eye drops, creams, and over-the-counter medicines.  Any problems you or family members have had with anesthetic medicines.  Any blood disorders you have.  Any surgeries you have had.  Any medical conditions you have.  Whether you are pregnant or may be pregnant. What are the risks? Generally, this is a safe procedure. However, problems may occur, including:  Harm to a pregnant woman and her unborn baby. This test involves the use of radiation. Radiation exposure can be dangerous to a pregnant woman and her unborn baby. If you are pregnant, you generally should not have this procedure done.  Slight increase in the risk of cancer. This is because of the radiation involved in the test. What happens before the procedure? No preparation is needed for this procedure. What happens during the procedure?  You will undress and remove any jewelry around your neck or chest.  You will put on a hospital gown.  Sticky electrodes will be placed on your chest. The electrodes will be connected to an  electrocardiogram (ECG) machine to record a tracing of the electrical activity of your heart.  A CT scanner will take pictures of your heart. During this time, you will be asked to lie still and hold your breath for 2-3 seconds while a picture of your heart is being taken. The procedure may vary among health care providers and hospitals. What happens after the procedure?  You can get dressed.  You can return to your normal activities.  It is up to you to get the results of your test. Ask your health care provider, or the department that is doing the test, when your results will be ready. Summary  A coronary calcium scan is an imaging test used to look for deposits of calcium and other fatty materials (plaques) in the inner lining of the blood vessels of the heart (coronary arteries).  Generally, this is a safe procedure. Tell your health care provider if you are pregnant or may be pregnant.  No preparation is needed for this procedure.  A CT scanner will take pictures of your heart.  You can return to your normal activities after the scan is done. This information is not intended to replace advice given to you by your health care provider. Make sure you discuss any questions you have with your health care provider. Document Released: 01/18/2008 Document Revised: 06/10/2016 Document Reviewed: 06/10/2016 Elsevier Interactive Patient Education  2017 [place]: At [facility], you and your health needs are our priority.  As part of our continuing mission to provide you with exceptional heart care, we have created designated  Provider Care Teams.  These Care Teams include your primary Cardiologist (physician) and Advanced Practice Providers (APPs -  Physician Assistants and Nurse Practitioners) who all work together to provide you with the care you need, when you need it.  Your next appointment:   AS NEEDED   The format for your next appointment:   In Person  Provider:    Quay Burow, MD

## 2020-12-05 NOTE — Assessment & Plan Note (Signed)
Whitney Foster is being seen for atypical chest pain which began several months ago, occurred several times a week but has since resolved over the last several weeks.  The pain occurred predominantly at night when she lies down with some occasional left upper extremity radiation.  Her only risk factor is hypertension.  I am going to get a coronary calcium score to further evaluate

## 2020-12-05 NOTE — Assessment & Plan Note (Signed)
History of obstructive sleep apnea.  She wears a CPAP intermittently.

## 2020-12-05 NOTE — Assessment & Plan Note (Signed)
History of dyspnea on exertion in the past with 2D echo performed by Dr. Debara Pickett 06/23/2018 which was essentially normal.  She no longer has dyspnea on exertion.

## 2020-12-05 NOTE — Progress Notes (Signed)
12/05/2020 Whitney Foster   1960-12-16  [id number]  Primary Physician Seward Carol, MD Primary Cardiologist: Lorretta Harp MD Whitney Foster, [place]  HPI:  Whitney Foster is a 60 y.o. moderately overweight married African-American female mother of 1 daughter who is wife Whitney Foster is also a patient of mine.  She was referred by Dr. Delfina Redwood, her PCP for evaluation of atypical chest pain.  She had seen Dr. Debara Pickett in the past and does see Dr. Claiborne Billings for her obstructive sleep apnea.  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  Her only cardiovascular risk factor is notable for treated hypertension.  There is no family history for heart disease.  She is never had a heart attack or stroke.  She was complaining of dyspnea and edema when she saw Dr. Debara Pickett 4 years ago which no longer is an issue.  2D echo performed at that time was essentially normal.  She does have obstructive sleep apnea and wears CPAP intermittently.  She also has GERD.  She retired 2 months ago and has been working out with a Clinical research associate recently as well.  She has had new onset left upper extremity pain over the last several months which has since resolved over the last several weeks.  The pain occurred mostly when recumbent with some left upper extremity radiation.   Current Meds  Medication Sig  . cetirizine (ZYRTEC) 10 MG tablet Take 10 mg by mouth daily.  Marland Kitchen ibuprofen (ADVIL,MOTRIN) 200 MG tablet Take 200 mg by mouth every 6 (six) hours as needed for pain.  Marland Kitchen losartan-hydrochlorothiazide (HYZAAR) 50-12.5 MG per tablet Take 1 tablet by mouth daily.     Allergies  Allergen Reactions  . Hydrocodone-Acetaminophen     Other reaction(s): GI Upset (intolerance)    Social History   Socioeconomic History  . Marital status: Married    Spouse name: Not on file  . Number of children: Not on file  . Years of education: Not on file  . Highest education level: Not on file   Occupational History  . Not on file  Tobacco Use  . Smoking status: Never Smoker  . Smokeless tobacco: Never Used  Substance and Sexual Activity  . Alcohol use: No  . Drug use: No  . Sexual activity: Yes    Birth control/protection: None  Other Topics Concern  . Not on file  Social History Narrative  . Not on file   Social Determinants of Health   Financial Resource Strain: Not on file  Food Insecurity: Not on file  Transportation Needs: Not on file  Physical Activity: Not on file  Stress: Not on file  Social Connections: Not on file  Intimate Partner Violence: Not on file     Review of Systems: General: negative for chills, fever, night sweats or weight changes.  Cardiovascular: negative for chest pain, dyspnea on exertion, edema, orthopnea, palpitations, paroxysmal nocturnal dyspnea or shortness of breath Dermatological: negative for rash Respiratory: negative for cough or wheezing Urologic: negative for hematuria Abdominal: negative for nausea, vomiting, diarrhea, bright red blood per rectum, melena, or hematemesis Neurologic: negative for visual changes, syncope, or dizziness All other systems reviewed and are otherwise negative except as noted above.    Blood pressure 140/84, pulse 77, height 5\' 2"  (1.575 m), weight 218 lb (98.9 kg).  General appearance: alert and no distress Neck: no adenopathy, no carotid bruit, no JVD, supple, symmetrical, trachea midline and thyroid  not enlarged, symmetric, no tenderness/mass/nodules Lungs: clear to auscultation bilaterally Heart: regular rate and rhythm, S1, S2 normal, no murmur, click, rub or gallop Extremities: extremities normal, atraumatic, no cyanosis or edema Pulses: 2+ and symmetric Skin: Skin color, texture, turgor normal. No rashes or lesions Neurologic: Alert and oriented X 3, normal strength and tone. Normal symmetric reflexes. Normal coordination and gait  EKG sinus rhythm at 77 without ST or T wave changes.  I  personally reviewed this EKG.  ASSESSMENT AND PLAN:   Essential hypertension History of essential hypertension blood pressure measured today 140/84.  She is on Hyzaar.  Lower extremity edema History of lower extremity edema in the past which no longer is an issue  DOE (dyspnea on exertion) History of dyspnea on exertion in the past with 2D echo performed by Dr. Debara Pickett 06/23/2018 which was essentially normal.  She no longer has dyspnea on exertion.  Sleep apnea History of obstructive sleep apnea.  She wears a CPAP intermittently.  Atypical chest pain Ms. Portillo is being seen for atypical chest pain which began several months ago, occurred several times a week but has since resolved over the last several weeks.  The pain occurred predominantly at night when she lies down with some occasional left upper extremity radiation.  Her only risk factor is hypertension.  I am going to get a coronary calcium score to further evaluate      Lorretta Harp MD [facility] 12/05/2020 3:10 PM

## 2020-12-05 NOTE — Assessment & Plan Note (Signed)
History of essential hypertension blood pressure measured today 140/84.  She is on Hyzaar.

## 2020-12-05 NOTE — Assessment & Plan Note (Signed)
History of lower extremity edema in the past which no longer is an issue

## 2021-01-08 ENCOUNTER — Other Ambulatory Visit: Payer: Self-pay

## 2021-01-08 ENCOUNTER — Ambulatory Visit
Admission: RE | Admit: 2021-01-08 | Discharge: 2021-01-08 | Disposition: A | Discharge status: Home / Self Care | Payer: Self-pay | Source: Ambulatory Visit | Attending: Cardiovascular Disease | Admitting: Cardiovascular Disease

## 2021-01-08 DIAGNOSIS — R0789 Other chest pain: Secondary | ICD-10-CM

## 2021-04-03 ENCOUNTER — Telehealth: Payer: Self-pay | Admitting: Cardiovascular Disease

## 2021-04-03 NOTE — Telephone Encounter (Signed)
Patient is requesting to go over results from CT on 01/08/21.

## 2021-04-03 NOTE — Telephone Encounter (Signed)
Patient made aware of results and verbalized understanding.   Lorretta Harp, MD  01/08/2021  3:20 PM EDT     Coronary calcium score of 0, no evidence of CAD.

## 2021-12-24 IMAGING — CT CT CARDIAC CORONARY ARTERY CALCIUM SCORE
3 series · 14 of 20 positions shown, 15 images · non-contrast
Comparison: None.
COMPARISON: None.

Addendum:
EXAM:
OVER-READ INTERPRETATION  CT CHEST

The following report is an over-read performed by radiologist Dr.
Morfakis Sisarwal [REDACTED] on 01/08/2021. This over-read
does not include interpretation of cardiac or coronary anatomy or
pathology. The coronary calcium score interpretation by the
cardiologist is attached.
CLINICAL DATA: Cardiovascular Disease Risk stratification
Coronary Calcium Score
TECHNIQUE: A gated, non-contrast computed tomography scan of the heart was
performed using 3mm slice thickness. Axial images were analyzed on a
dedicated workstation. Calcium scoring of the coronary arteries was
performed using the Agatston method.

[Series 2: casc 3.0 bv41 2 bestdiast 73 % · axial · 0.42mm/px · z∈[-198,-141]mm · 4 of 33 slices shown, 5 images]
[im 7/33  vessel]
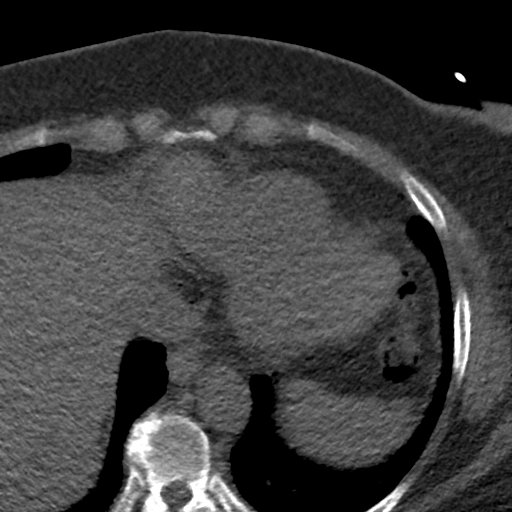
[im 7/33  lung]
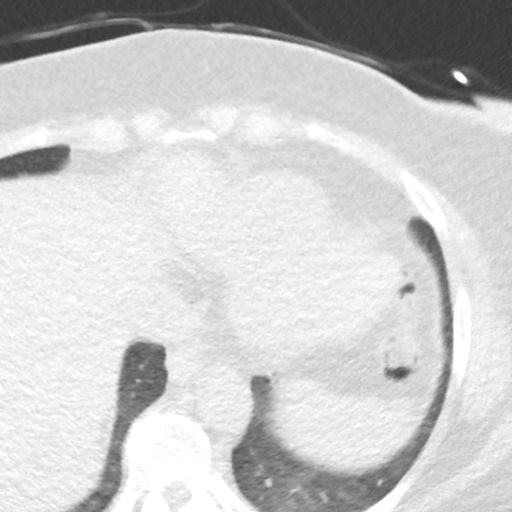
[im 13/33  vessel]
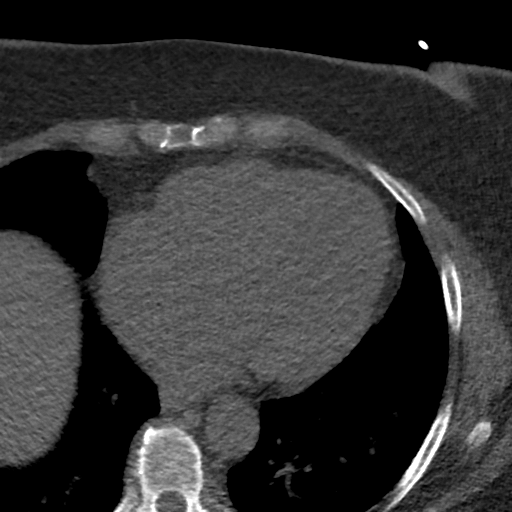
[im 20/33  vessel]
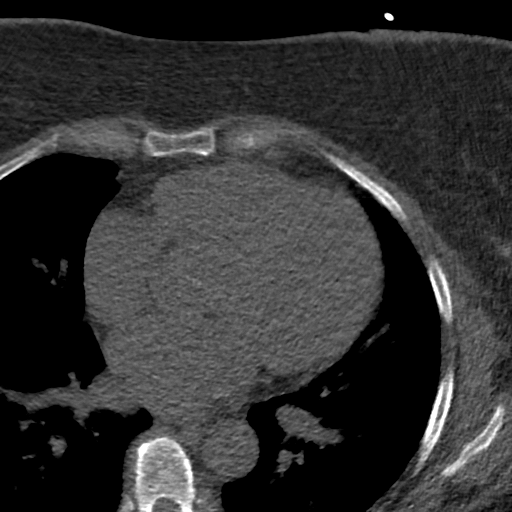
[im 26/33  vessel]
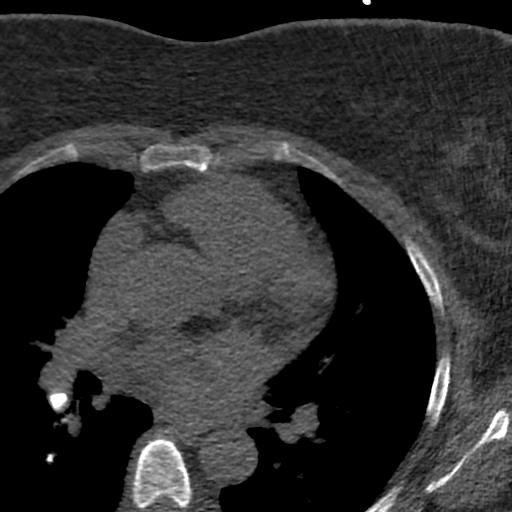

[Series 3: lung 75 % · axial · 0.63mm/px · z∈[-201,-138]mm · 5 of 33 slices shown]
[im 6/33  lung]
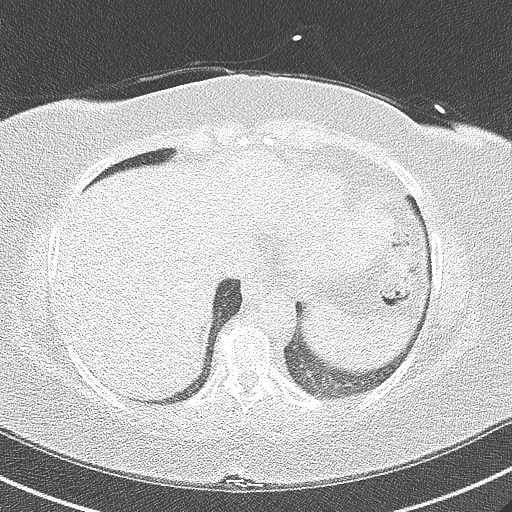
[im 11/33  lung]
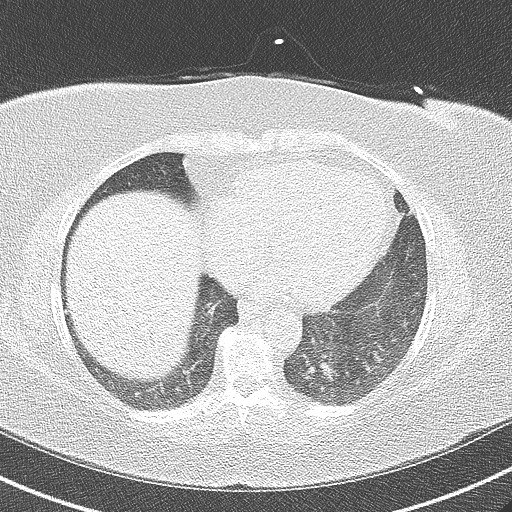
[im 17/33  lung]
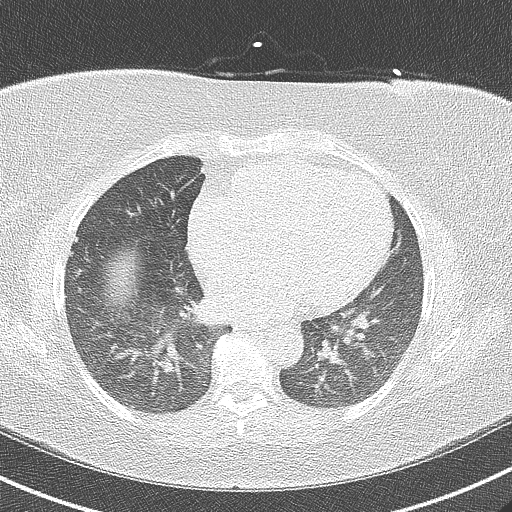
[im 22/33  lung]
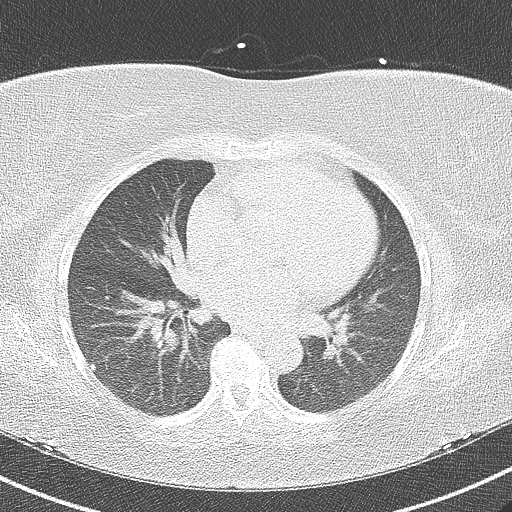
[im 27/33  lung]
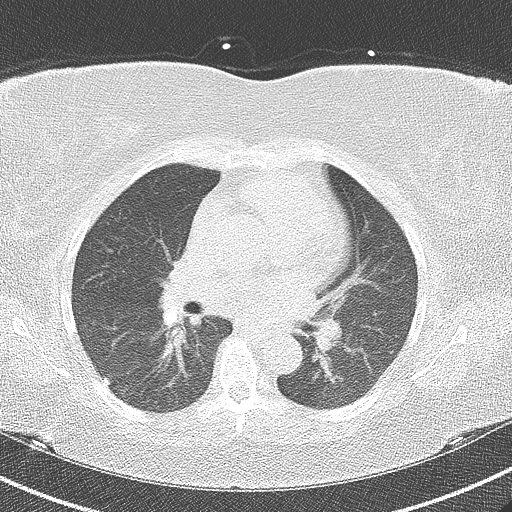

[Series 4: lung st 75 % · axial · 0.63mm/px · z∈[-201,-138]mm · 5 of 33 slices shown]
[im 6/33  lung]
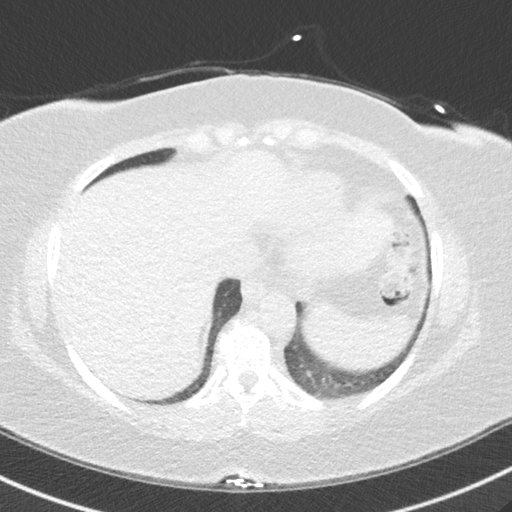
[im 11/33  lung]
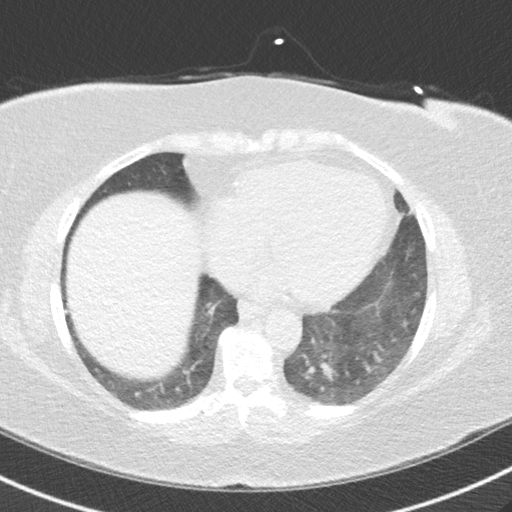
[im 17/33  lung]
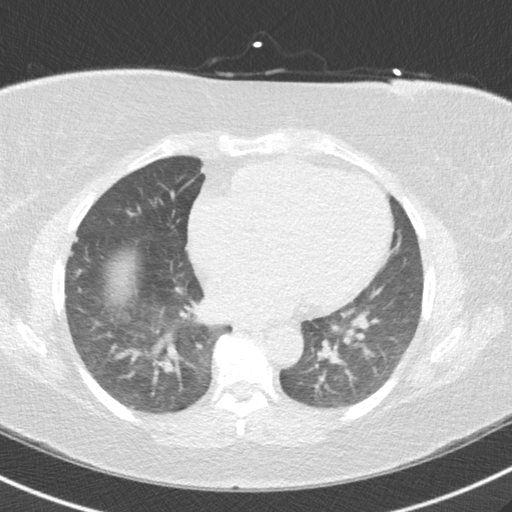
[im 22/33  lung]
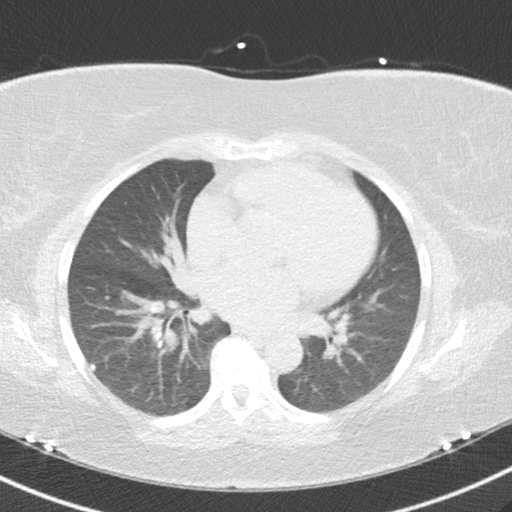
[im 27/33  lung]
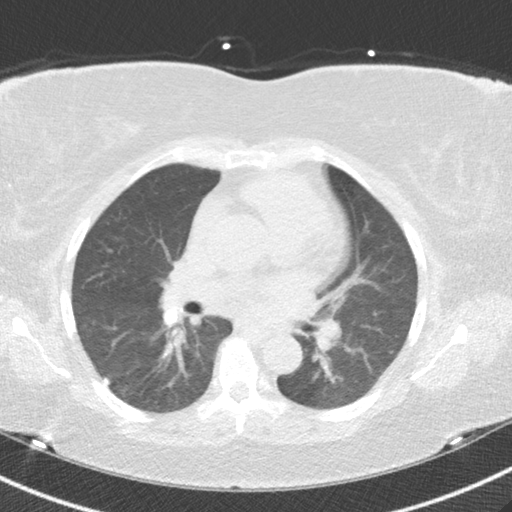

[14 of 20 positions shown; findings below may reference images not displayed]

FINDINGS: Vascular: Heart is normal size.  Aorta normal caliber.

Mediastinum/Nodes: Calcified mediastinal and right hilar lymph
nodes. No adenopathy.

Lungs/Pleura: Calcified granulomas in the right lower lobe. No
confluent opacities or effusions.

Upper Abdomen: Imaging into the upper abdomen demonstrates no acute
findings.

Musculoskeletal: Chest wall soft tissues are unremarkable. No acute
bony abnormality.
IMPRESSION: Evidence of old granulomatous disease.

No acute extra cardiac abnormality.
FINDINGS: Coronary Calcium Score:

Left main: 0

Left anterior descending artery: 0

Left circumflex artery: 0

Right coronary artery: 0

Total: 0

Percentile: 0

Pericardium: Normal.

Ascending Aorta: Normal caliber.

Non-cardiac: See separate report from [REDACTED].
IMPRESSION: Coronary calcium score of 0. This was 0 percentile for age-, race-,
and sex-matched controls.



If CAC=0, it is reasonable to withhold statin therapy and reassess
in 5 to 10 years, as long as higher risk conditions are absent
(diabetes mellitus, family history of premature CHD in first degree
relatives (males <55 years; females <65 years), cigarette smoking,
or LDL >=190 mg/dL).

If CAC is 1 to 99, it is reasonable to initiate statin therapy for
patients >=55 years of age.

If CAC is >=100 or >=75th percentile, it is reasonable to initiate
statin therapy at any age.

Cardiology referral should be considered for patients with CAC
scores >=400 or >=75th percentile.

*5757 AHA/ACC/AACVPR/AAPA/ABC/DONIK/LAQUITA/MONSERRAT/Mafmudi/ETEMI/KJELL-EINAR/MOATSHE
Guideline on the Management of Blood Cholesterol: A Report of the
American College of Cardiology/American Heart Association Task Force
on Clinical Practice Guidelines. J Am Coll Cardiol.
4436;73(24):7052-7002.

*** End of Addendum ***
EXAM:
OVER-READ INTERPRETATION  CT CHEST

The following report is an over-read performed by radiologist Dr.
Morfakis Sisarwal [REDACTED] on 01/08/2021. This over-read
does not include interpretation of cardiac or coronary anatomy or
pathology. The coronary calcium score interpretation by the
cardiologist is attached.
FINDINGS: Vascular: Heart is normal size.  Aorta normal caliber.

Mediastinum/Nodes: Calcified mediastinal and right hilar lymph
nodes. No adenopathy.

Lungs/Pleura: Calcified granulomas in the right lower lobe. No
confluent opacities or effusions.

Upper Abdomen: Imaging into the upper abdomen demonstrates no acute
findings.

Musculoskeletal: Chest wall soft tissues are unremarkable. No acute
bony abnormality.
IMPRESSION: Evidence of old granulomatous disease.

No acute extra cardiac abnormality.

## 2022-03-05 DIAGNOSIS — Z0289 Encounter for other administrative examinations: Secondary | ICD-10-CM

## 2022-03-13 ENCOUNTER — Encounter (INDEPENDENT_AMBULATORY_CARE_PROVIDER_SITE_OTHER): Payer: Self-pay

## 2022-03-19 ENCOUNTER — Ambulatory Visit (INDEPENDENT_AMBULATORY_CARE_PROVIDER_SITE_OTHER): Payer: 59 | Admitting: Family Medicine

## 2022-03-19 ENCOUNTER — Encounter (INDEPENDENT_AMBULATORY_CARE_PROVIDER_SITE_OTHER): Payer: Self-pay | Admitting: Family Medicine

## 2022-03-19 VITALS — BP 144/84 | HR 76 | Temp 97.3°F | Ht 62.0 in | Wt 220.0 lb

## 2022-03-19 DIAGNOSIS — G4733 Obstructive sleep apnea (adult) (pediatric): Secondary | ICD-10-CM

## 2022-03-19 DIAGNOSIS — R0602 Shortness of breath: Secondary | ICD-10-CM | POA: Diagnosis not present

## 2022-03-19 DIAGNOSIS — R5383 Other fatigue: Secondary | ICD-10-CM | POA: Diagnosis not present

## 2022-03-19 DIAGNOSIS — F5089 Other specified eating disorder: Secondary | ICD-10-CM | POA: Diagnosis not present

## 2022-03-19 DIAGNOSIS — I10 Essential (primary) hypertension: Secondary | ICD-10-CM

## 2022-03-19 DIAGNOSIS — M25569 Pain in unspecified knee: Secondary | ICD-10-CM

## 2022-03-19 DIAGNOSIS — Z1331 Encounter for screening for depression: Secondary | ICD-10-CM

## 2022-03-19 DIAGNOSIS — G8929 Other chronic pain: Secondary | ICD-10-CM

## 2022-03-19 DIAGNOSIS — Z9189 Other specified personal risk factors, not elsewhere classified: Secondary | ICD-10-CM

## 2022-03-19 DIAGNOSIS — Z6841 Body Mass Index (BMI) 40.0 and over, adult: Secondary | ICD-10-CM

## 2022-03-19 DIAGNOSIS — E559 Vitamin D deficiency, unspecified: Secondary | ICD-10-CM

## 2022-03-19 NOTE — Progress Notes (Signed)
Entered in error

## 2022-03-20 LAB — VITAMIN D 25 HYDROXY (VIT D DEFICIENCY, FRACTURES): Vit D, 25-Hydroxy: 14.5 ng/mL — ABNORMAL LOW (ref 30.0–100.0)

## 2022-03-20 LAB — VITAMIN B12: Vitamin B-12: 423 pg/mL (ref 232–1245)

## 2022-03-20 LAB — HEMOGLOBIN A1C
Est. average glucose Bld gHb Est-mCnc: 120 mg/dL
Hgb A1c MFr Bld: 5.8 % — ABNORMAL HIGH (ref 4.8–5.6)

## 2022-03-20 LAB — INSULIN, RANDOM: INSULIN: 8 u[IU]/mL (ref 2.6–24.9)

## 2022-03-20 LAB — FOLATE: Folate: 4.9 ng/mL (ref >3.0)

## 2022-03-24 NOTE — Progress Notes (Signed)
Chief Complaint:   OBESITY Whitney Foster (MR# [id number]) is a 61 y.o. female who presents for evaluation and treatment of obesity and related comorbidities. Current BMI is Body mass index is 40.24 kg/m. Whitney Foster has been struggling with her weight for many years and has been unsuccessful in either losing weight, maintaining weight loss, or reaching her healthy weight goal.  Whitney Foster is currently in the action stage of change and ready to dedicate time achieving and maintaining a healthier weight. Whitney Foster is interested in becoming our patient and working on intensive lifestyle modifications including (but not limited to) diet and exercise for weight loss.  Whitney Foster works part-time, 15 hours a week and lives with her husband, Whitney Foster. Pt craves sweets/cookies and skips breakfast daily. She has been going to the gym 3 days a week and wants to get healthier.  Whitney Foster's habits were reviewed today and are as follows: Her family eats meals together, her desired weight loss is 70 lbs, she started gaining weight in the year 2000, she is a picky eater and doesn't like to eat healthier foods, she skips meals frequently, she is frequently drinking liquids with calories, she frequently makes poor food choices, she has binge eating behaviors, and she struggles with emotional eating.  Depression Screen Whitney Foster's Food and Mood (modified PHQ-9) score was 4.     03/19/2022    9:26 AM  Depression screen PHQ 2/9  Decreased Interest 1  Down, Depressed, Hopeless 0  PHQ - 2 Score 1  Altered sleeping 1  Tired, decreased energy 1  Change in appetite 0  Feeling bad or failure about yourself  0  Trouble concentrating 1  Moving slowly or fidgety/restless 0  Suicidal thoughts 0  PHQ-9 Score 4  Difficult doing work/chores Not difficult at all   Subjective:   1. Other fatigue Whitney Foster admits to daytime somnolence and admits to waking up still tired. Patient has a history of symptoms of daytime  fatigue and morning fatigue. Whitney Foster generally gets 7 hours of sleep per night, and states that she has poor sleep quality. Snoring is not present. Apneic episodes are not present. Epworth Sleepiness Score is 6. ECG done 02/04/2022 as part of a [place] weight loss study. Pt was on Rybelsus and was very nauseated. ECG with normal sinus rhythm at rate of 82 and no acute abnormalities. Reviewed with pt.  2. SOB (shortness of breath) on exertion Whitney Foster notes increasing shortness of breath with exercising and seems to be worsening over time with weight gain. She notes getting out of breath sooner with activity than she used to. This has gotten worse recently. Whitney Foster denies shortness of breath at rest or orthopnea.  3. Essential hypertension Pt is treated by [facility] physician, Dr. Delfina Foster. She reports BP at home usually runs 120's/80's. 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 She eats her emotions and is interested in counseling.  5. Chronic knee pain, unspecified laterality Whitney Foster is also with hip pains and needs a hip replacement- Whitney Foster. She doesn't need surgery right now but will in the future, if she doesn't lose weight.  6. Vitamin D deficiency Pt with history of Vit D in the past. She last took supplements several years ago. Pt has been monitored through medication management and the weight loss study she was in.  7. OSA (obstructive sleep apnea) She has a CPAP machine but does not wear it. It has been  a year since she last used it. Pt was last tested in 2019 and was seen by Dr. Claiborne Foster in March 2019.  8. At risk for impaired metabolic function Whitney Foster is at risk for impaired metabolic function due to eating habits and medication history.  Assessment/Plan:   Orders Placed This Encounter  Procedures   VITAMIN D 25 Hydroxy (Vit-D Deficiency, Fractures)   Insulin, random   Hemoglobin A1c   Folate   Vitamin B12     There are no discontinued medications.   No orders of the defined types were placed in this encounter.    1. Other fatigue Whitney Foster does feel that her weight is causing her energy to be lower than it should be. Fatigue may be related to obesity, depression or many other causes. Labs will be ordered, and in the meanwhile, Whitney Foster will focus on self care including making healthy food choices, increasing physical activity and focusing on stress reduction.  Lab/Orders today: - Folate - Vitamin B12  2. SOB (shortness of breath) on exertion Whitney Foster does feel that she gets out of breath more easily that she used to when she exercises. Whitney Foster's shortness of breath appears to be obesity related and exercise induced. She has agreed to work on weight loss and gradually increase exercise to treat her exercise induced shortness of breath. Will continue to monitor closely.  3. Essential hypertension BP not at goal. Whitney Foster is working on healthy weight loss and exercise to improve blood pressure control. We will watch for signs of hypotension as she continues her lifestyle modifications. Continue medication per PCP. Focus on prudent nutritional plan and weight loss.  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  Orders and follow up as documented in patient record. Referral to Dr. Mallie Foster placed and pt will make appt.  5. Chronic knee pain, unspecified laterality Treatment per orthopedics. Focus on prudent nutritional plan and weight loss.  6. Vitamin D deficiency Low Vitamin D level contributes to fatigue and are associated with obesity, breast, and colon cancer. She will follow-up for routine testing of Vitamin D, at least 2-3 times per year to avoid over-replacement.  Lab/Orders today: - VITAMIN D 25 Hydroxy  (Vit-D Deficiency, Fractures)  7. OSA (obstructive sleep apnea) Intensive lifestyle modifications are the first line treatment for this issue. We discussed several lifestyle modifications today and she will continue to work on diet, exercise and weight loss efforts. We will continue to monitor. Orders and follow up as documented in patient record.  Pt encouraged to wear CPAP nightly. Follow up with Dr. Claiborne Foster with questions regarding mask or need for treatment. ESS=6 today; weight loss and sleep hygiene are very important to weight loss journey.  8. Depression screen Whitney Foster had a positive depression screening. Depression is commonly associated with obesity and often results in emotional eating behaviors. We will monitor this closely and work on CBT to help improve the non-hunger eating patterns. Referral to Psychology may be required if no improvement is seen as she continues in our clinic.  9. At risk for impaired metabolic function Due to Whitney Foster's current state of health and medical condition(s), she is at a significantly higher risk for impaired metabolic function.   At least >23 minutes was spent on counseling Whitney Foster about these concerns today.  This places the patient at a much greater risk to subsequently develop cardio-pulmonary conditions  that can negatively affect the patient's quality of life.  I stressed the importance of reversing these risks factors.  The initial goal is to lose at least 5-10% of starting weight to help reduce risk factors.  Counseling:  Intensive lifestyle modifications discussed with Whitney Foster as the most appropriate first line treatment.  she will continue to work on diet, exercise, and weight loss efforts.  We will continue to reassess these conditions on a fairly regular basis in an attempt to decrease the patient's overall morbidity and mortality.  10. Class 3 severe obesity with serious comorbidity and body mass index (BMI) of 40.0 to 44.9 in adult, unspecified  obesity type (HCC) Whitney Foster is currently in the action stage of change and her goal is to continue with weight loss efforts. I recommend Whitney Foster begin the structured treatment plan as follows:  She has agreed to the Category 2 Plan with only 100 snack calories.  ECG done 01/28/2022 and labs done with PCP in June 2023. Labs reviewed: CBC, CMP, FLP, TSH  Exercise goals:  As is.    Behavioral modification strategies: increasing lean protein intake, keeping healthy foods in the home, and planning for success.  She was informed of the importance of frequent follow-up visits to maximize her success with intensive lifestyle modifications for her multiple health conditions. She was informed we would discuss her lab results at her next visit unless there is a critical issue that needs to be addressed sooner. Whitney Foster agreed to keep her next visit at the agreed upon time to discuss these results.  Objective:   Blood pressure (!) 144/84, pulse 76, temperature (!) 97.3 F (36.3 C), height '5\' 2"'$  (1.575 m), weight 220 lb (99.8 kg), last menstrual period 04/14/2013, SpO2 100 %. Body mass index is 40.24 kg/m.  EKG: Normal sinus rhythm, rate 77.  Indirect Calorimeter completed today shows a VO2 of 232 and a REE of 1598.  Her calculated basal metabolic rate is 9629 thus her basal metabolic rate is better than expected.  General: Cooperative, alert, well developed, in no acute distress. HEENT: Conjunctivae and lids unremarkable. Cardiovascular: Regular rhythm.  Lungs: Normal work of breathing. Neurologic: No focal deficits.   Lab Results  Component Value Date   CREATININE 0.74 10/08/2012   BUN 8 10/08/2012   NA 139 10/08/2012   K 3.9 10/08/2012   CL 107 10/08/2012   CO2 24 10/08/2012   Lab Results  Component Value Date   ALT 8 10/08/2012   AST 11 10/08/2012   ALKPHOS 76 10/08/2012   BILITOT 0.2 (L) 10/08/2012   Lab Results  Component Value Date   HGBA1C 5.8 (H) 03/19/2022   Lab Results   Component Value Date   INSULIN 8.0 03/19/2022   No results found for: "TSH" No results found for: "CHOL", "HDL", "LDLCALC", "LDLDIRECT", "TRIG", "CHOLHDL" Lab Results  Component Value Date   WBC 7.2 10/09/2012   HGB 9.5 (L) 10/09/2012   HCT 30.4 (L) 10/09/2012   MCV 67.3 (L) 10/09/2012   PLT 334 10/09/2012   Lab Results  Component Value Date   IRON 17 (L) 10/08/2012   TIBC 444 10/08/2012   FERRITIN 5 (L) 10/08/2012    Attestation Statements:   Reviewed by clinician on day of visit: allergies, medications, problem list, medical history, surgical history, family history, social history, and previous encounter notes.  I, Kathlene November, BS, CMA, am acting as transcriptionist for [employer], DO.   I have reviewed the above documentation for accuracy and completeness,  and I agree with the above. [name], D.O.  The [employer] was signed into law in 2016 which includes the topic of electronic health records.  This provides immediate access to information in MyChart.  This includes consultation notes, operative notes, office notes, lab results and pathology reports.  If you have any questions about what you read please let us know at your next visit so we can discuss your concerns and take corrective action if need be.  We are right here with you.

## 2022-03-26 ENCOUNTER — Encounter (INDEPENDENT_AMBULATORY_CARE_PROVIDER_SITE_OTHER): Payer: Self-pay

## 2022-03-26 ENCOUNTER — Encounter (INDEPENDENT_AMBULATORY_CARE_PROVIDER_SITE_OTHER): Payer: 59 | Admitting: Psychology

## 2022-03-26 ENCOUNTER — Telehealth (INDEPENDENT_AMBULATORY_CARE_PROVIDER_SITE_OTHER): Payer: Self-pay | Admitting: Psychology

## 2022-03-26 NOTE — Telephone Encounter (Signed)
  Office: [phone]  /  Fax: [phone]  Date of Call: March 26, 2022  Time of Call: 3:03pm Duration of Call: 1.5 minute(s) Provider: Glennie Isle, PsyD  CONTENT:  This provider called Whitney Foster to check-in as she joined the appointment via [place] Visit, but left. Whitney Foster explained her car broke down and she was on the side of the road with AAA there assisting. She was receptive to rescheduling and thanked this provider for understanding. No evidence or endorsement of any safety concerns. All questions/concerns addressed.   PLAN:  Whitney Foster is scheduled for an appointment on 04/02/2022 at [place].

## 2022-03-26 NOTE — Progress Notes (Signed)
Office: [phone]  /  Fax: [phone]    Date: 04/02/2022   Appointment Start Time: 9:02am Duration: 30 minutes Provider: Glennie Isle, Psy.D. Type of Session: Intake for Individual Therapy  Location of Patient: Home (private location) Location of Provider: Provider's home (private office) Type of Contact: Telepsychological Visit via MyChart Video Visit  Informed Consent: Prior to proceeding with today's appointment, two pieces of identifying information were obtained. In addition, Rashae's physical location at the time of this appointment was obtained as well a phone number she could be reached at in the event of technical difficulties. [name] and this provider participated in today's telepsychological service.   The provider's role was explained to [name]. The provider reviewed and discussed issues of confidentiality, privacy, and limits therein (e.g., reporting obligations). In addition to verbal informed consent, written informed consent for psychological services was obtained prior to the initial appointment. Since the clinic is not a 24/7 crisis center, mental health emergency resources were shared and this  provider explained MyChart, e-mail, voicemail, and/or other messaging systems should be utilized only for non-emergency reasons. This provider also explained that information obtained during appointments will be placed in Kamori's medical record and relevant information will be shared with other providers at [facility] for coordination of care. Aleatha agreed information may be shared with other [facility] providers as needed for coordination of care and by signing the service agreement document, she provided written consent for coordination of care. Prior to initiating telepsychological services, Nayomi completed an informed consent document, which included the development of a safety plan (i.e., an emergency contact and emergency  resources) in the event of an emergency/crisis. Rokhaya verbally acknowledged understanding she is ultimately responsible for understanding her insurance benefits for telepsychological and in-person services. This provider also reviewed confidentiality, as it relates to telepsychological services. Sharnita  acknowledged understanding that appointments cannot be recorded without both party consent and she is aware she is responsible for securing confidentiality on her end of the session. Mary-Ann verbally consented to proceed.  Chief Complaint/HPI: Whitney Foster was referred by Dr. Mellody Dance due to "Other disorder of eating- emotional eating". Per the note for the initial visit with Dr. Mellody Dance on 03/19/2022, "Pt has no history of depression or anxiety. She eats her emotions and is interested in counseling." The note for the initial appointment further indicated the following: "Mercede's habits were reviewed today and are as follows: Her family eats meals together, her desired weight loss is 70 lbs, she started gaining weight in the year 2000, she is a picky eater and doesn't like to eat healthier foods, she skips meals frequently, she is frequently drinking liquids with calories, she frequently makes poor food choices, she has binge eating behaviors, and she struggles with emotional eating." Meyer's Food and Mood (modified PHQ-9) score on 03/19/2022 was 4.  During today's appointment, Lyana stated she will engage in emotional eating behaviors at night "just because" or if she is stressed. She also discussed a history of weight loss, noting she will "revert" back to old habits. She was verbally administered a questionnaire assessing various behaviors related to emotional eating behaviors. Rozina endorsed the following: overeat when you are celebrating, eat certain foods when you are anxious, stressed, depressed, or your feelings are hurt, use food to help you cope with emotional situations, find  food is comforting to you, overeat when you are angry or upset, overeat when you are worried about something, overeat frequently when you are  bored or lonely, overeat when you are alone, but eat much less when you are with other people, eat to help you stay awake, and eat as a reward. She shared she craves sweets (e.g., chocolate, cookies). Valecia believes the onset of emotional eating behaviors was likely in the past 15-20 years due to work-related stress, and described the current frequency of emotional eating behaviors as "few times a month." In addition, Antoniette denied a history of binge eating behaviors. Emrys denied a history of significantly restricting food intake, purging and engagement in other compensatory strategies, and has never been diagnosed with an eating disorder. She also denied a history of treatment for emotional eating behaviors. Furthermore, Jahyra denied other problems of concern.    Mental Status Examination:  Appearance: neat Behavior: appropriate to circumstances Mood: neutral Affect: mood congruent Speech: WNL Eye Contact: appropriate Psychomotor Activity: WNL Gait: unable to assess  Thought Process: linear, logical, and goal directed and denies suicidal, homicidal, and self-harm ideation, plan and intent  Thought Content/Perception: no hallucinations, delusions, bizarre thinking or behavior endorsed or observed Orientation: AAOx4 Memory/Concentration: memory, attention, language, and fund of knowledge intact  Insight/Judgment: fair  Family & Psychosocial History: Arma reported she is married and she has one daughter (age 45). 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 Additionally, Trent shared her highest level of education obtained is a bachelor's degree. Currently, Priyal's social support system consists of her husband, friends, and family. Moreover, Darline stated she resides with  her husband.   Medical History:  Past Medical History:  Diagnosis Date   Anemia    secondary to heavy menses- transfused in the past   Back pain    Edema of both lower extremities    Fibroids    "2000"   GERD (gastroesophageal reflux disease)    Hip pain    Hypertension    Joint pain    Obesity    BMI 41   Pre-diabetes    Sciatic pain    Sleep apnea    Vitamin D deficiency    Past Surgical History:  Procedure Laterality Date   BREAST EXCISIONAL BIOPSY Left    CARPAL TUNNEL RELEASE N/A    CESAREAN SECTION     "1998"   [place]   vocal nodules     Current Outpatient Medications on File Prior to Visit  Medication Sig Dispense Refill   cetirizine (ZYRTEC) 10 MG tablet Take 10 mg by mouth daily.     ibuprofen (ADVIL,MOTRIN) 200 MG tablet Take 200 mg by mouth every 6 (six) hours as needed for pain. (Patient not taking: Reported on 03/19/2022)     losartan-hydrochlorothiazide (HYZAAR) 50-12.5 MG per tablet Take 1 tablet by mouth daily.     meloxicam (MOBIC) 15 MG tablet Take 15 mg by mouth daily.     No current facility-administered medications on file prior to visit.  Bleu reported she is medication compliant.   Mental Health History: Anistyn reported she attended grief therapy around 2020/2021 after losing her mother. She denied a history of psychotropic medications. Natia reported there is no history of hospitalizations for psychiatric concerns. Irlanda denied a family history of mental health/substance abuse related concerns. Ameera reported there is no history of trauma including psychological, physical , and sexual abuse, as well as neglect.  Farran described her typical mood lately as "good,"  but "kind of emotional" as her daughter's best friend passed away last month. She further reported experiencing worry about her daughter that is away at school. Pearlean reported she consumes a standard  pour of wine every couple months. She denied tobacco use. She denied illicit/recreational substance use. Furthermore, Hollyanne indicated she is not experiencing the following: hallucinations and delusions, paranoia, symptoms of mania , social withdrawal, crying spells, panic attacks, memory concerns, attention and concentration issues, and obsessions and compulsions. She also denied history of and current suicidal ideation, plan, and intent; history of and current homicidal ideation, plan, and intent; and history of and current engagement in self-harm.  The following strengths were reported by [place]: caring, committed, supportive, dedicated, loyal, and helping others. The following strengths were observed by this provider: ability to express thoughts and feelings during the therapeutic session, ability to establish and benefit from a therapeutic relationship, willingness to work toward established goal(s) with the clinic and ability to engage in reciprocal conversation.   Legal History: Karesha reported there is no history of legal involvement.   Structured Assessments Results: The Patient Health Questionnaire-9 (PHQ-9) is a self-report measure that assesses symptoms and severity of depression over the course of the last two weeks. Breeanne obtained a score of 0. [0= Not at all; 1= Several days; 2= More than half the days; 3= Nearly every day] Little interest or pleasure in doing things 0  Feeling down, depressed, or hopeless 0  Trouble falling or staying asleep, or sleeping too much 0  Feeling tired or having little energy 0  Poor appetite or overeating 0  Feeling bad about yourself --- or that you are a failure or have let yourself or your family down 0  Trouble concentrating on things, such as reading the newspaper or watching television 0  Moving or speaking so slowly that other people could have noticed? Or the opposite --- being so fidgety or restless that you have been moving around a lot  more than usual 0  Thoughts that you would be better off dead or hurting yourself in some way 0  PHQ-9 Score 0    The Generalized Anxiety Disorder-7 (GAD-7) is a brief self-report measure that assesses symptoms of anxiety over the course of the last two weeks. Herma obtained a score of 0. [0= Not at all; 1= Several days; 2= Over half the days; 3= Nearly every day] Feeling nervous, anxious, on edge 0  Not being able to stop or control worrying 0  Worrying too much about different things 0  Trouble relaxing 0  Being so restless that it's hard to sit still 0  Becoming easily annoyed or irritable 0  Feeling afraid as if something awful might happen 0  GAD-7 Score 0   Interventions:  Conducted a chart review Focused on rapport building Verbally administered PHQ-9 and GAD-7 for symptom monitoring Verbally administered Food & Mood questionnaire to assess various behaviors related to emotional eating Provided emphatic reflections and validation Collaborated with patient on a treatment goal  Psychoeducation provided regarding physical versus emotional hunger  Diagnostic Impressions & Provisional DSM-5 Diagnosis(es): Sentoria discussed a history of engagement in emotional eating behaviors starting 15-20 years ago secondary to work-related stressors and described the current frequency as a few times a month. She denied engagement in any other disordered eating behaviors. Based on the aforementioned, the following diagnosis was assigned: F50.89 Other Specified Feeding or Eating Disorder, Emotional Eating Behaviors.  Plan: Awa appears able and willing to participate as evidenced  by collaboration on a treatment goal, engagement in reciprocal conversation, and asking questions as needed for clarification. The next appointment is scheduled for 04/15/2022 at 10am, which will be via MyChart Video Visit. The following treatment goal was established: increase coping skills. This provider will regularly  review the treatment plan and medical chart to keep informed of status changes. Guy expressed understanding and agreement with the initial treatment plan of care. Caydee will be sent a handout via e-mail to utilize between now and the next appointment to increase awareness of hunger patterns and subsequent eating. Matea provided verbal consent during today's appointment for this provider to send the handout via e-mail.

## 2022-04-02 ENCOUNTER — Encounter (INDEPENDENT_AMBULATORY_CARE_PROVIDER_SITE_OTHER): Payer: Self-pay | Admitting: Family Medicine

## 2022-04-02 ENCOUNTER — Ambulatory Visit (INDEPENDENT_AMBULATORY_CARE_PROVIDER_SITE_OTHER): Payer: 59 | Admitting: Family Medicine

## 2022-04-02 ENCOUNTER — Telehealth (INDEPENDENT_AMBULATORY_CARE_PROVIDER_SITE_OTHER): Payer: 59 | Admitting: Psychology

## 2022-04-02 VITALS — BP 121/82 | HR 74 | Temp 97.6°F | Ht 62.0 in | Wt 214.0 lb

## 2022-04-02 DIAGNOSIS — E559 Vitamin D deficiency, unspecified: Secondary | ICD-10-CM | POA: Diagnosis not present

## 2022-04-02 DIAGNOSIS — E669 Obesity, unspecified: Secondary | ICD-10-CM

## 2022-04-02 DIAGNOSIS — F5089 Other specified eating disorder: Secondary | ICD-10-CM | POA: Diagnosis not present

## 2022-04-02 DIAGNOSIS — R7303 Prediabetes: Secondary | ICD-10-CM | POA: Diagnosis not present

## 2022-04-02 DIAGNOSIS — I10 Essential (primary) hypertension: Secondary | ICD-10-CM

## 2022-04-02 DIAGNOSIS — E538 Deficiency of other specified B group vitamins: Secondary | ICD-10-CM

## 2022-04-02 DIAGNOSIS — Z6839 Body mass index (BMI) 39.0-39.9, adult: Secondary | ICD-10-CM

## 2022-04-02 DIAGNOSIS — Z9189 Other specified personal risk factors, not elsewhere classified: Secondary | ICD-10-CM

## 2022-04-02 MED ORDER — VITAMIN D (ERGOCALCIFEROL) 1.25 MG (50000 UNIT) PO CAPS: 50000.0000 [IU] | ORAL_CAPSULE | ORAL | 0 refills | Status: DC

## 2022-04-02 MED ORDER — CYANOCOBALAMIN 500 MCG PO TABS: ORAL_TABLET | ORAL | Status: DC

## 2022-04-11 NOTE — Progress Notes (Signed)
Chief Complaint:   OBESITY Whitney Foster is here to discuss her progress with her obesity treatment plan along with follow-up of her obesity related diagnoses. Whitney Foster is on the Category 2 Plan with 200 snack calories and states she is following her eating plan approximately 40% of the time. Zakariah states she is not currently exercising.  Today's visit was #: 2 Starting weight: 220 lbs Starting date: 03/19/2022 Today's weight: 214 lbs Today's date: 04/02/2022 Total lbs lost to date: 6 Total lbs lost since last in-office visit: 6  Interim History: Whitney Foster is here today for her first follow-up office visit since starting the program with [name].  All blood work/ lab tests that were recently ordered by myself or an outside provider were reviewed with patient today per their request.   Extended time was spent counseling her on all new disease processes that were discovered or preexisting ones that are affected by BMI.  she understands that many of these abnormalities will need to monitored regularly along with the current treatment plan of prudent dietary changes, in which we are making each and every office visit, to improve these health parameters. Pt only did the plan for 1 week or less. She did great and lost 17 pounds of fat mass and gained 5+ lbs of muscle.  Subjective:   1. Essential hypertension Discussed labs with patient today.- BMP Cardiovascular ROS: no chest pain or dyspnea on exertion.  2. Prediabetes New. Discussed labs with patient today.- Fasting insulin and A1c Pt denies hunger or cravings. She is not eating all foods on plan.  3. Other disorder of eating- emotional eating Discussed labs with patient today.- Thyroid labs Pt did see Dr. Mallie Mussel this morning.  4. Vitamin D deficiency New. Discussed labs with patient today.- Vit D Ingrid endorses fatigue. She is currently taking prescription vitamin D 50,000 IU each week. She denies nausea, vomiting or muscle  weakness.  5. B12 deficiency New. Discussed labs with patient today.- B12 She reports fatigue.  6. At risk for diabetes mellitus Whitney Foster is at higher than average risk for developing diabetes due to her obesity and new-onset pre-diabetes.  Assessment/Plan:  No orders of the defined types were placed in this encounter.   There are no discontinued medications.   Meds ordered this encounter  Medications   Vitamin D, Ergocalciferol, (DRISDOL) 1.25 MG (50000 UNIT) CAPS capsule    Sig: Take 1 capsule (50,000 Units total) by mouth every 7 (seven) days.    Dispense:  4 capsule    Refill:  0    30 d supply;  ** OV for RF **   Do not send RF request   cyanocobalamin (VITAMIN B12) 500 MCG tablet    Sig: 200-500 mcg daily otc     1. Essential hypertension BP at goal today, and much improved from prior. [name] is working on healthy weight loss and exercise to improve blood pressure control. We will watch for signs of hypotension as she continues her lifestyle modifications.  2. Prediabetes A1c and fasting insulin are not at goal. [name] will continue to work on weight loss, exercise, and decreasing simple carbohydrates to help decrease the risk of diabetes.  Handouts: Insulin Resistance; Pre-diabetes  3. Other disorder of eating- emotional eating Behavior modification techniques were discussed today to help Whitney Foster deal with her emotional/non-hunger eating behaviors.  Orders and follow up as documented in patient record. Continue with Dr. Mallie Mussel. Strategies discussed with pt- only eat at the table and  not in front of the tv.  4. Vitamin D deficiency Not at goal. Low Vitamin D level contributes to fatigue and are associated with obesity, breast, and colon cancer. She agrees to start prescription Vitamin D '@50'$ ,000 IU every week and will follow-up for routine testing of Vitamin D, at least 2-3 times per year to avoid over-replacement.  Start- Vitamin D, Ergocalciferol, (DRISDOL) 1.25 MG  (50000 UNIT) CAPS capsule; Take 1 capsule (50,000 Units total) by mouth every 7 (seven) days.  Dispense: 4 capsule; Refill: 0  5. B12 deficiency Not at goal. The diagnosis was reviewed with the patient. Counseling provided today, see below. We will continue to monitor. Orders and follow up as documented in patient record.  Counseling The body needs vitamin B12: to make red blood cells; to make DNA; and to help the nerves work properly so they can carry messages from the brain to the body.  The main causes of vitamin B12 deficiency include dietary deficiency, digestive diseases, pernicious anemia, and having a surgery in which part of the stomach or small intestine is removed.  Certain medicines can make it harder for the body to absorb vitamin B12. These medicines include: heartburn medications; some antibiotics; some medications used to treat diabetes, gout, and high cholesterol.  In some cases, there are no symptoms of this condition. If the condition leads to anemia or nerve damage, various symptoms can occur, such as weakness or fatigue, shortness of breath, and numbness or tingling in your hands and feet.   Treatment:  May include taking vitamin B12 supplements.  Avoid alcohol.  Eat lots of healthy foods that contain vitamin B12: Beef, pork, chicken, Whitney Foster, and organ meats, such as liver.  Seafood: This includes clams, rainbow trout, salmon, tuna, and haddock. Eggs.  Cereal and dairy products that are fortified: This means that vitamin B12 has been added to the food.   Start- cyanocobalamin (VITAMIN B12) 500 MCG tablet; 200-500 mcg daily otc  6. At risk for diabetes mellitus - Whitney Foster was given diabetes prevention education and counseling today of more than 24 minutes.  - Counseled patient on pathophysiology of disease and meaning/ implication of lab results.  - Reviewed how certain foods can either stimulate or inhibit insulin release, and subsequently affect hunger pathways  -  Importance of following a healthy meal plan with limiting amounts of simple carbohydrates discussed with patient - Effects of regular aerobic exercise on blood sugar regulation reviewed and encouraged an eventual goal of 30 min 5d/week or more as a minimum.  - Briefly discussed treatment options, which always include dietary and lifestyle modification as first line.   - Handouts provided at patient's desire and/or told to go online to the American Diabetes Association website for further information.  7. Obesity, current BMI 39.3 Liel is currently in the action stage of change. As such, her goal is to continue with weight loss efforts. She has agreed to the Category 2 Plan with lunch options and protein equivalents.   Exercise goals:  As is  Behavioral modification strategies: increasing lean protein intake, no skipping meals, and planning for success.  Kashina has agreed to follow-up with our clinic in 2-3 weeks. She was informed of the importance of frequent follow-up visits to maximize her success with intensive lifestyle modifications for her multiple health conditions.   Objective:   Blood pressure 121/82, pulse 74, temperature 97.6 F (36.4 C), height '5\' 2"'$  (1.575 m), weight 214 lb (97.1 kg), last menstrual period 04/14/2013, SpO2 100 %.  Body mass index is 39.14 kg/m.  General: Cooperative, alert, well developed, in no acute distress. HEENT: Conjunctivae and lids unremarkable. Cardiovascular: Regular rhythm.  Lungs: Normal work of breathing. Neurologic: No focal deficits.   Lab Results  Component Value Date   CREATININE 0.74 10/08/2012   BUN 8 10/08/2012   NA 139 10/08/2012   K 3.9 10/08/2012   CL 107 10/08/2012   CO2 24 10/08/2012   Lab Results  Component Value Date   ALT 8 10/08/2012   AST 11 10/08/2012   ALKPHOS 76 10/08/2012   BILITOT 0.2 (L) 10/08/2012   Lab Results  Component Value Date   HGBA1C 5.8 (H) 03/19/2022   Lab Results  Component Value Date    INSULIN 8.0 03/19/2022   No results found for: "TSH" No results found for: "CHOL", "HDL", "LDLCALC", "LDLDIRECT", "TRIG", "CHOLHDL" Lab Results  Component Value Date   VD25OH 14.5 (L) 03/19/2022   Lab Results  Component Value Date   WBC 7.2 10/09/2012   HGB 9.5 (L) 10/09/2012   HCT 30.4 (L) 10/09/2012   MCV 67.3 (L) 10/09/2012   PLT 334 10/09/2012   Lab Results  Component Value Date   IRON 17 (L) 10/08/2012   TIBC 444 10/08/2012   FERRITIN 5 (L) 10/08/2012    Attestation Statements:   Reviewed by clinician on day of visit: allergies, medications, problem list, medical history, surgical history, family history, social history, and previous encounter notes.  I, Kathlene November, BS, CMA, am acting as transcriptionist for [employer], DO.   I have reviewed the above documentation for accuracy and completeness, and I agree with the above. [name], D.O.  The [place] was signed into law in 2016 which includes the topic of electronic health records.  This provides immediate access to information in MyChart.  This includes consultation notes, operative notes, office notes, lab results and pathology reports.  If you have any questions about what you read please let us know at your next visit so we can discuss your concerns and take corrective action if need be.  We are right here with you.

## 2022-04-12 ENCOUNTER — Other Ambulatory Visit: Payer: Self-pay | Admitting: Internal Medicine

## 2022-04-12 DIAGNOSIS — Z1231 Encounter for screening mammogram for malignant neoplasm of breast: Secondary | ICD-10-CM

## 2022-04-15 ENCOUNTER — Telehealth (INDEPENDENT_AMBULATORY_CARE_PROVIDER_SITE_OTHER): Payer: 59 | Admitting: Psychology

## 2022-04-15 DIAGNOSIS — F5089 Other specified eating disorder: Secondary | ICD-10-CM | POA: Diagnosis not present

## 2022-04-15 NOTE — Progress Notes (Signed)
  Office: [phone]  /  Fax: [phone]    Date: April 15, 2022    Appointment Start Time: 10:00am Duration: 31 minutes Provider: Glennie Isle, Psy.D. Type of Session: Individual Therapy  Location of Patient: Home (private location) Location of Provider: Provider's Home (private office) Type of Contact: Telepsychological Visit via MyChart Video Visit/ Telephone call  Session Content: Whitney Foster is a 61 y.o. female presenting for a follow-up appointment to address the previously established treatment goal of increasing coping skills.Today's appointment was a telepsychological visit. Whitney Foster provided verbal consent for today's telepsychological appointment and she is aware she is responsible for securing confidentiality on her end of the session. Prior to proceeding with today's appointment, Whitney Foster's physical location at the time of this appointment was obtained as well a phone number she could be reached at in the event of technical difficulties. Whitney Foster and this provider participated in today's telepsychological service. Of note, today's appointment was switched to a regular telephone call at 10:03am with Whitney Foster's verbal consent as she was unable to connect with video/audio capabilities.   This provider conducted a brief check-in. Whitney Foster stated she is "focusing on following the plan." She indicated hip pain can trigger engagement in emotional eating behaviors. Further explored and processed. Psychoeducation regarding triggers for emotional eating was provided. Whitney Foster was provided a handout, and encouraged to utilize the handout between now and the next appointment to increase awareness of triggers and frequency. Whitney Foster agreed. This provider also discussed behavioral strategies for specific triggers, such as placing the utensil down when conversing to avoid mindless eating. Whitney Foster provided verbal consent during today's appointment for this provider to send a handout about triggers  via e-mail. Whitney Foster was receptive to today's appointment as evidenced by openness to sharing, responsiveness to feedback, and willingness to explore triggers for emotional eating.  Mental Status Examination:  Appearance: unable to assess  Behavior: unable to assess Mood: neutral Affect: unable to fully assess Speech: WNL Eye Contact: unable to assess Psychomotor Activity: unable to assess  Gait: unable to assess Thought Process: linear, logical, and goal directed and no evidence or endorsement of suicidal, homicidal, and self-harm ideation, plan and intent  Thought Content/Perception: no hallucinations, delusions, bizarre thinking or behavior endorsed or observed Orientation: AAOx4 Memory/Concentration: memory, attention, language, and fund of knowledge intact  Insight: good Judgment: fair  Interventions:  Conducted a brief chart review Provided empathic reflections and validation Reviewed content from the previous session Provided positive reinforcement Employed supportive psychotherapy interventions to facilitate reduced distress and to improve coping skills with identified stressors Psychoeducation provided regarding triggers for emotional eating behaviors  DSM-5 Diagnosis(es): F50.89 Other Specified Feeding or Eating Disorder, Emotional Eating Behaviors  Treatment Goal & Progress: During the initial appointment with this provider, the following treatment goal was established: increase coping skills. Progress is limited, as Whitney Foster has just begun treatment with this provider; however, she is receptive to the interaction and interventions and rapport is being established.   Plan: The next appointment is scheduled for 04/30/2022 at 4pm, which will be via MyChart Video Visit. The next session will focus on working towards the established treatment goal.

## 2022-04-17 ENCOUNTER — Encounter (INDEPENDENT_AMBULATORY_CARE_PROVIDER_SITE_OTHER): Payer: Self-pay | Admitting: Family Medicine

## 2022-04-17 ENCOUNTER — Ambulatory Visit
Admission: RE | Admit: 2022-04-17 | Discharge: 2022-04-17 | Disposition: A | Discharge status: Home / Self Care | Payer: 59 | Source: Ambulatory Visit | Attending: Internal Medicine | Admitting: Internal Medicine

## 2022-04-17 ENCOUNTER — Ambulatory Visit (INDEPENDENT_AMBULATORY_CARE_PROVIDER_SITE_OTHER): Payer: 59 | Admitting: Family Medicine

## 2022-04-17 VITALS — BP 129/83 | HR 70 | Temp 97.7°F | Ht 62.0 in | Wt 215.0 lb

## 2022-04-17 DIAGNOSIS — Z6839 Body mass index (BMI) 39.0-39.9, adult: Secondary | ICD-10-CM | POA: Diagnosis not present

## 2022-04-17 DIAGNOSIS — E559 Vitamin D deficiency, unspecified: Secondary | ICD-10-CM | POA: Diagnosis not present

## 2022-04-17 DIAGNOSIS — E669 Obesity, unspecified: Secondary | ICD-10-CM

## 2022-04-17 DIAGNOSIS — Z1231 Encounter for screening mammogram for malignant neoplasm of breast: Secondary | ICD-10-CM

## 2022-04-17 DIAGNOSIS — R7303 Prediabetes: Secondary | ICD-10-CM

## 2022-04-17 DIAGNOSIS — Z9189 Other specified personal risk factors, not elsewhere classified: Secondary | ICD-10-CM

## 2022-04-17 MED ORDER — VITAMIN D (ERGOCALCIFEROL) 1.25 MG (50000 UNIT) PO CAPS: 50000.0000 [IU] | ORAL_CAPSULE | ORAL | 0 refills | Status: DC

## 2022-04-17 MED ORDER — METFORMIN HCL 500 MG PO TABS: ORAL_TABLET | ORAL | 0 refills | Status: DC

## 2022-04-24 NOTE — Progress Notes (Signed)
Chief Complaint:   OBESITY Whitney Foster is here to discuss her progress with her obesity treatment plan along with follow-up of her obesity related diagnoses. Whitney Foster is on the Category 2 Plan lunch options and protein equivalents and states she is following her eating plan approximately 95-98% of the time. Whitney Foster states she is not currently exercising.  Today's visit was #: 3 Starting weight: 220 lbs Starting date: 03/19/2022 Today's weight: 215 lbs Today's date: 04/17/2022 Total lbs lost to date: 5 Total lbs lost since last in-office visit: +1  Interim History: Whitney Foster has had increased snacking and craves something crunchy or sweet after dinner. Started last week. Pt's stress level has increased and thinks it is emotional eating. Pt misses her vegetables. She is seeing Dr. Mallie Mussel.  Subjective:   1. Prediabetes Whitney Foster has a diagnosis of prediabetes based on her elevated HgA1c and was informed this puts her at greater risk of developing diabetes. She continues to work on diet and exercise to decrease her risk of diabetes. She denies nausea or hypoglycemia.  2. Vitamin D deficiency She is currently taking prescription vitamin D 50,000 IU each week. She denies nausea, vomiting or muscle weakness.  3. At risk for side effect of medication Bridgid is at risk for side effects of medication due to starting Metformin.  Assessment/Plan:  No orders of the defined types were placed in this encounter.   Medications Discontinued During This Encounter  Medication Reason   Vitamin D, Ergocalciferol, (DRISDOL) 1.25 MG (50000 UNIT) CAPS capsule Reorder     Meds ordered this encounter  Medications   Vitamin D, Ergocalciferol, (DRISDOL) 1.25 MG (50000 UNIT) CAPS capsule    Sig: Take 1 capsule (50,000 Units total) by mouth every 7 (seven) days.    Dispense:  4 capsule    Refill:  0    30 d supply;  ** OV for RF **   Do not send RF request   metFORMIN (GLUCOPHAGE) 500 MG tablet     Sig: 1/2 po with lunch daily    Dispense:  30 tablet    Refill:  0    30 d supply;  ** OV for RF **   Do not send RF request     1. Prediabetes Whitney Foster will continue to work on weight loss, exercise, and decreasing simple carbohydrates to help decrease the risk of diabetes.  The risks and benefits of Metformin were discussed wit pt. Handout on Metformin given to pt. Start Metformin 250 mg daily with lunch.  Start- metFORMIN (GLUCOPHAGE) 500 MG tablet; 1/2 po with lunch daily  Dispense: 30 tablet; Refill: 0  2. Vitamin D deficiency Low Vitamin D level contributes to fatigue and are associated with obesity, breast, and colon cancer. She agrees to continue to take prescription Vitamin D '@50'$ ,000 IU every week and will follow-up for routine testing of Vitamin D, at least 2-3 times per year to avoid over-replacement.  Refill- Vitamin D, Ergocalciferol, (DRISDOL) 1.25 MG (50000 UNIT) CAPS capsule; Take 1 capsule (50,000 Units total) by mouth every 7 (seven) days.  Dispense: 4 capsule; Refill: 0  3. At risk for side effect of medication Due to Whitney Foster's current conditions and medications, she is at a higher risk for drug side effect.  At least 15 minutes was spent on counseling her about these concerns today.  We discussed the benefits and potential risks of these medications, and all of patient's concerns were addressed and questions were answered.  she will call us, or  their PCP or other specialists who treat their conditions with medications, with any questions or concerns that may develop.    4. Obesity, current BMI 39.3 Whitney Foster is currently in the action stage of change. As such, her goal is to continue with weight loss efforts. She has agreed to change to the [place].   Eat everything on plan and measure it.  Exercise goals:  As is  Behavioral modification strategies: increasing lean protein intake, decreasing simple carbohydrates, and planning for success.  Whitney Foster has agreed  to follow-up with our clinic in 2-3 weeks. She was informed of the importance of frequent follow-up visits to maximize her success with intensive lifestyle modifications for her multiple health conditions.   Objective:   Blood pressure 129/83, pulse 70, temperature 97.7 F (36.5 C), height '5\' 2"'$  (1.575 m), weight 215 lb (97.5 kg), last menstrual period 04/14/2013, SpO2 94 %. Body mass index is 39.32 kg/m.  General: Cooperative, alert, well developed, in no acute distress. HEENT: Conjunctivae and lids unremarkable. Cardiovascular: Regular rhythm.  Lungs: Normal work of breathing. Neurologic: No focal deficits.   Lab Results  Component Value Date   CREATININE 0.74 10/08/2012   BUN 8 10/08/2012   NA 139 10/08/2012   K 3.9 10/08/2012   CL 107 10/08/2012   CO2 24 10/08/2012   Lab Results  Component Value Date   ALT 8 10/08/2012   AST 11 10/08/2012   ALKPHOS 76 10/08/2012   BILITOT 0.2 (L) 10/08/2012   Lab Results  Component Value Date   HGBA1C 5.8 (H) 03/19/2022   Lab Results  Component Value Date   INSULIN 8.0 03/19/2022   No results found for: "TSH" No results found for: "CHOL", "HDL", "LDLCALC", "LDLDIRECT", "TRIG", "CHOLHDL" Lab Results  Component Value Date   VD25OH 14.5 (L) 03/19/2022   Lab Results  Component Value Date   WBC 7.2 10/09/2012   HGB 9.5 (L) 10/09/2012   HCT 30.4 (L) 10/09/2012   MCV 67.3 (L) 10/09/2012   PLT 334 10/09/2012   Lab Results  Component Value Date   IRON 17 (L) 10/08/2012   TIBC 444 10/08/2012   FERRITIN 5 (L) 10/08/2012   Attestation Statements:   Reviewed by clinician on day of visit: allergies, medications, problem list, medical history, surgical history, family history, social history, and previous encounter notes.  I, Kathlene November, BS, CMA, am acting as transcriptionist for [employer], DO.   I have reviewed the above documentation for accuracy and completeness, and I agree with the above. Whitney Foster,  D.O.  The [place] was signed into law in 2016 which includes the topic of electronic health records.  This provides immediate access to information in MyChart.  This includes consultation notes, operative notes, office notes, lab results and pathology reports.  If you have any questions about what you read please let us know at your next visit so we can discuss your concerns and take corrective action if need be.  We are right here with you.

## 2022-04-30 ENCOUNTER — Telehealth (INDEPENDENT_AMBULATORY_CARE_PROVIDER_SITE_OTHER): Payer: 59 | Admitting: Psychology

## 2022-04-30 DIAGNOSIS — F5089 Other specified eating disorder: Secondary | ICD-10-CM

## 2022-04-30 NOTE — Progress Notes (Signed)
  Office: [phone]  /  Fax: [phone]    Date: April 30, 2022    Appointment Start Time: 4:04pm Duration: 28 minutes Provider: Glennie Isle, Psy.D. Type of Session: Individual Therapy  Location of Patient: Parked in car outside of store in [place] (private/safe location; address obtained) Location of Provider: Provider's Home (private office) Type of Contact: Telepsychological Visit via MyChart Video Visit  Session Content:This provider called Whitney Foster at 4:02pm as she joined the appointment, but left. Assistance was provided. As such, today's appointment was initiated 4 minutes late. Whitney Foster is a 61 y.o. female presenting for a follow-up appointment to address the previously established treatment goal of increasing coping skills.Today's appointment was a telepsychological visit. Whitney Foster provided verbal consent for today's telepsychological appointment and she is aware she is responsible for securing confidentiality on her end of the session. Prior to proceeding with today's appointment, Whitney Foster's physical location at the time of this appointment was obtained as well a phone number she could be reached at in the event of technical difficulties. Whitney Foster and this provider participated in today's telepsychological service.   This provider conducted a brief check-in. Whitney Foster acknowledged some challenges with her structured meal plan, but indicated, "I'm doing good overall." She further explained feeling she has a lot on her plate. Further explored and processed. Reviewed triggers for emotional eating behaviors. Remainder of today's appointment focused on problem solving to help Whitney Foster eat congruent to her structured meal plan regularly while still taking account current responsibilities/stressors. She agreed to the following: having a working plan for meals/snacks; making hard boiled eggs in bulk; doubling recipes; and taking snacks congruent to the structured meal plan to run errands. This  provider also discussed the possible consequences of weighing daily. She agreed to reduce weighing daily to twice a week. Overall, Whitney Foster was receptive to today's appointment as evidenced by openness to sharing, responsiveness to feedback, and willingness to implement discussed strategies .  Mental Status Examination:  Appearance: neat Behavior: appropriate to circumstances Mood: neutral Affect: mood congruent Speech: WNL Eye Contact: appropriate Psychomotor Activity: WNL Gait: unable to assess Thought Process: linear, logical, and goal directed and no evidence or endorsement of suicidal, homicidal, and self-harm ideation, plan and intent  Thought Content/Perception: no hallucinations, delusions, bizarre thinking or behavior endorsed or observed Orientation: AAOx4 Memory/Concentration: memory, attention, language, and fund of knowledge intact  Insight: fair Judgment: fair  Interventions:  Conducted a brief chart review Provided empathic reflections and validation Reviewed content from the previous session Employed supportive psychotherapy interventions to facilitate reduced distress and to improve coping skills with identified stressors Engaged patient in problem solving  DSM-5 Diagnosis(es): F50.89 Other Specified Feeding or Eating Disorder, Emotional Eating Behaviors  Treatment Goal & Progress: During the initial appointment with this provider, the following treatment goal was established: increase coping skills. Whitney Foster has demonstrated progress in her goal as evidenced by increased awareness of hunger patterns and increased awareness of triggers for emotional eating behaviors.   Plan: The next appointment is scheduled for 05/14/2022 at 8am, which will be via [place] Visit. The next session will focus on working towards the established treatment goal.

## 2022-05-01 ENCOUNTER — Other Ambulatory Visit (INDEPENDENT_AMBULATORY_CARE_PROVIDER_SITE_OTHER): Payer: Self-pay | Admitting: Family Medicine

## 2022-05-01 DIAGNOSIS — E559 Vitamin D deficiency, unspecified: Secondary | ICD-10-CM

## 2022-05-02 ENCOUNTER — Other Ambulatory Visit (INDEPENDENT_AMBULATORY_CARE_PROVIDER_SITE_OTHER): Payer: Self-pay | Admitting: Family Medicine

## 2022-05-02 DIAGNOSIS — R7303 Prediabetes: Secondary | ICD-10-CM

## 2022-05-08 ENCOUNTER — Ambulatory Visit (INDEPENDENT_AMBULATORY_CARE_PROVIDER_SITE_OTHER): Payer: 59 | Admitting: Family Medicine

## 2022-05-14 ENCOUNTER — Telehealth (INDEPENDENT_AMBULATORY_CARE_PROVIDER_SITE_OTHER): Payer: 59 | Admitting: Psychology

## 2022-05-21 ENCOUNTER — Telehealth (INDEPENDENT_AMBULATORY_CARE_PROVIDER_SITE_OTHER): Payer: Self-pay | Admitting: Psychology

## 2022-05-21 NOTE — Telephone Encounter (Signed)
  Office: [phone]  /  Fax: [phone]  Date of Call: May 21, 2022  Time of Call: 10:38am Provider: Glennie Isle, PsyD  CONTENT: This provider called Whitney Foster to check-in and schedule a follow-up appointment. A HIPAA compliant voicemail was left requesting a call back.   PLAN: This provider will wait for [name] to call back. No further follow-up planned by this provider.

## 2022-05-29 ENCOUNTER — Encounter (INDEPENDENT_AMBULATORY_CARE_PROVIDER_SITE_OTHER): Payer: Self-pay | Admitting: Family Medicine

## 2022-05-29 ENCOUNTER — Ambulatory Visit (INDEPENDENT_AMBULATORY_CARE_PROVIDER_SITE_OTHER): Payer: 59 | Admitting: Family Medicine

## 2022-05-29 VITALS — BP 124/78 | HR 84 | Temp 98.0°F | Ht 62.0 in | Wt 216.0 lb

## 2022-05-29 DIAGNOSIS — R7303 Prediabetes: Secondary | ICD-10-CM

## 2022-05-29 DIAGNOSIS — M549 Dorsalgia, unspecified: Secondary | ICD-10-CM | POA: Diagnosis not present

## 2022-05-29 DIAGNOSIS — E669 Obesity, unspecified: Secondary | ICD-10-CM

## 2022-05-29 DIAGNOSIS — Z6839 Body mass index (BMI) 39.0-39.9, adult: Secondary | ICD-10-CM

## 2022-05-29 DIAGNOSIS — E66813 Obesity, class 3: Secondary | ICD-10-CM

## 2022-05-29 DIAGNOSIS — E559 Vitamin D deficiency, unspecified: Secondary | ICD-10-CM | POA: Diagnosis not present

## 2022-05-29 DIAGNOSIS — G8929 Other chronic pain: Secondary | ICD-10-CM

## 2022-05-29 MED ORDER — METFORMIN HCL 500 MG PO TABS: ORAL_TABLET | ORAL | 0 refills | Status: DC

## 2022-05-29 MED ORDER — VITAMIN D (ERGOCALCIFEROL) 1.25 MG (50000 UNIT) PO CAPS: 50000.0000 [IU] | ORAL_CAPSULE | ORAL | 0 refills | Status: DC

## 2022-06-11 NOTE — Progress Notes (Signed)
Chief Complaint:   OBESITY Whitney Foster is here to discuss her progress with her obesity treatment plan along with follow-up of her obesity related diagnoses. Whitney Foster is on the [place] and states she is following her eating plan approximately 75% of the time. Whitney Foster states she is not currently exercising.  Today's visit was #: 4 Starting weight: 220 lbs Starting date: 03/19/2022 Today's weight: 216 lbs Today's date: 05/29/2022 Total lbs lost to date: 4 Total lbs lost since last in-office visit: 0  Interim History: Patient has gained 1 pound since her last office visit.  Lately she has had cravings for sweets, but she is skipping meals.  She recognizes when she eats on the plan.  She has no cravings or hunger.  We changed to the vegetarian plan at her last office visit and she likes it.  However, she is still eating a lot of off the plan food.  Subjective:   1. Prediabetes Patient was started on metformin at her last office visit.  She denies side effects, but she is not taking it consistently.  2. Vitamin D deficiency She is currently taking prescription vitamin D 50,000 IU each week. She denies nausea, vomiting or muscle weakness.  3. Chronic back pain, unspecified back location, unspecified back pain laterality  Patient notes her pain is causing her to sit around all day at home.  She does not move much, and she is feeling depressed because of it.  She is snacking more.  Assessment/Plan:  No orders of the defined types were placed in this encounter.   Medications Discontinued During This Encounter  Medication Reason   Vitamin D, Ergocalciferol, (DRISDOL) 1.25 MG (50000 UNIT) CAPS capsule Reorder   metFORMIN (GLUCOPHAGE) 500 MG tablet Reorder     Meds ordered this encounter  Medications   metFORMIN (GLUCOPHAGE) 500 MG tablet    Sig: 1 po with lunch daily    Dispense:  30 tablet    Refill:  0    30 d supply;  ** OV for RF **   Do not send RF request   Vitamin  D, Ergocalciferol, (DRISDOL) 1.25 MG (50000 UNIT) CAPS capsule    Sig: Take 1 capsule (50,000 Units total) by mouth every 7 (seven) days.    Dispense:  4 capsule    Refill:  0    30 d supply;  ** OV for RF **   Do not send RF request     1. Prediabetes Patient will continue metformin at 500 mg once daily with lunch, and we will refill for 1 month.  She will continue with her weight loss via her prudent nutritional plan.  - metFORMIN (GLUCOPHAGE) 500 MG tablet; 1 po with lunch daily  Dispense: 30 tablet; Refill: 0  2. Vitamin D deficiency Low Vitamin D level contributes to fatigue and are associated with obesity, breast, and colon cancer. We will refill prescription Vitamin D for 1 month. She will follow-up for routine testing of Vitamin D, at least 2-3 times per year to avoid over-replacement.  - Vitamin D, Ergocalciferol, (DRISDOL) 1.25 MG (50000 UNIT) CAPS capsule; Take 1 capsule (50,000 Units total) by mouth every 7 (seven) days.  Dispense: 4 capsule; Refill: 0  3. Chronic back pain, unspecified back location, unspecified back pain laterality  Patient is to follow-up with her back specialist.  She is to get up and walk every day.  She will continue with her prudent nutritional plan and weight loss, as this will help  with her back pain.  4. Obesity, current BMI 39.5 Whitney Foster is currently in the action stage of change. As such, her goal is to continue with weight loss efforts. She has agreed to the [place].   Patient is to not skip meals.  We will consider changing to journaling at her next office visit.  Exercise goals: All adults should avoid inactivity. Some physical activity is better than none, and adults who participate in any amount of physical activity gain some health benefits.  Behavioral modification strategies: increasing lean protein intake, decreasing simple carbohydrates, no skipping meals, and planning for success.  Sherman has agreed to follow-up with our  clinic in 2 to 3 weeks. She was informed of the importance of frequent follow-up visits to maximize her success with intensive lifestyle modifications for her multiple health conditions.   Objective:   Blood pressure 124/78, pulse 84, temperature 98 F (36.7 C), height '5\' 2"'$  (1.575 m), weight 216 lb (98 kg), last menstrual period 04/14/2013, SpO2 98 %. Body mass index is 39.51 kg/m.  General: Cooperative, alert, well developed, in no acute distress. HEENT: Conjunctivae and lids unremarkable. Cardiovascular: Regular rhythm.  Lungs: Normal work of breathing. Neurologic: No focal deficits.   Lab Results  Component Value Date   CREATININE 0.74 10/08/2012   BUN 8 10/08/2012   NA 139 10/08/2012   K 3.9 10/08/2012   CL 107 10/08/2012   CO2 24 10/08/2012   Lab Results  Component Value Date   ALT 8 10/08/2012   AST 11 10/08/2012   ALKPHOS 76 10/08/2012   BILITOT 0.2 (L) 10/08/2012   Lab Results  Component Value Date   HGBA1C 5.8 (H) 03/19/2022   Lab Results  Component Value Date   INSULIN 8.0 03/19/2022   No results found for: "TSH" No results found for: "CHOL", "HDL", "LDLCALC", "LDLDIRECT", "TRIG", "CHOLHDL" Lab Results  Component Value Date   VD25OH 14.5 (L) 03/19/2022   Lab Results  Component Value Date   WBC 7.2 10/09/2012   HGB 9.5 (L) 10/09/2012   HCT 30.4 (L) 10/09/2012   MCV 67.3 (L) 10/09/2012   PLT 334 10/09/2012   Lab Results  Component Value Date   IRON 17 (L) 10/08/2012   TIBC 444 10/08/2012   FERRITIN 5 (L) 10/08/2012   Attestation Statements:   Reviewed by clinician on day of visit: allergies, medications, problem list, medical history, surgical history, family history, social history, and previous encounter notes.   Wilhemena Durie, am acting as transcriptionist for [employer], DO.   I have reviewed the above documentation for accuracy and completeness, and I agree with the above. [name], D.O.  The [place]  was signed into law in 2016 which includes the topic of electronic health records.  This provides immediate access to information in MyChart.  This includes consultation notes, operative notes, office notes, lab results and pathology reports.  If you have any questions about what you read please let us know at your next visit so we can discuss your concerns and take corrective action if need be.  We are right here with you.

## 2022-06-12 ENCOUNTER — Other Ambulatory Visit (INDEPENDENT_AMBULATORY_CARE_PROVIDER_SITE_OTHER): Payer: Self-pay | Admitting: Family Medicine

## 2022-06-12 DIAGNOSIS — R7303 Prediabetes: Secondary | ICD-10-CM

## 2022-06-26 ENCOUNTER — Ambulatory Visit (INDEPENDENT_AMBULATORY_CARE_PROVIDER_SITE_OTHER): Payer: 59 | Admitting: Family Medicine

## 2022-07-11 ENCOUNTER — Ambulatory Visit (INDEPENDENT_AMBULATORY_CARE_PROVIDER_SITE_OTHER): Payer: 59 | Admitting: Family Medicine

## 2022-07-11 ENCOUNTER — Encounter (INDEPENDENT_AMBULATORY_CARE_PROVIDER_SITE_OTHER): Payer: Self-pay | Admitting: Family Medicine

## 2022-07-11 VITALS — BP 134/83 | HR 81 | Temp 97.7°F | Ht 62.0 in | Wt 222.2 lb

## 2022-07-11 DIAGNOSIS — Z6841 Body Mass Index (BMI) 40.0 and over, adult: Secondary | ICD-10-CM

## 2022-07-11 DIAGNOSIS — F5089 Other specified eating disorder: Secondary | ICD-10-CM

## 2022-07-11 DIAGNOSIS — E669 Obesity, unspecified: Secondary | ICD-10-CM | POA: Diagnosis not present

## 2022-07-11 DIAGNOSIS — E559 Vitamin D deficiency, unspecified: Secondary | ICD-10-CM

## 2022-07-11 DIAGNOSIS — F509 Eating disorder, unspecified: Secondary | ICD-10-CM | POA: Insufficient documentation

## 2022-07-11 MED ORDER — VITAMIN D (ERGOCALCIFEROL) 1.25 MG (50000 UNIT) PO CAPS: 50000.0000 [IU] | ORAL_CAPSULE | ORAL | 0 refills | Status: DC

## 2022-07-16 NOTE — Progress Notes (Signed)
Chief Complaint:   OBESITY Whitney Foster is here to discuss her progress with her obesity treatment plan along with follow-up of her obesity related diagnoses. Whitney Foster is on the [place] and states she is following her eating plan approximately 30-40% of the time. Whitney Foster states she is not exercising.  Today's visit was #: 5 Starting weight: 220 LBS Starting date: 03/19/2022 Today's weight: 222 LBS Today's date: 07/11/2022 Total lbs lost to date: 0 Total lbs lost since last in-office visit: +6 LBS  Interim History: " I have been eating a lot of Thanksgiving day leftovers and on spaghetti etc".  She has had spaghetti the past 5-6 nights with very little protein intake.  Subjective:   1. Vitamin D deficiency Whitney Foster is tolerating medication(s) well without side effects.  Medication compliance is good as patient endorses taking it as prescribed.  Symptoms are stable and the patient denies additional concerns regarding this condition.  Compliance has been good.  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  She also has not seen Dr. Mallie Mussel since September.  Assessment/Plan:  No orders of the defined types were placed in this encounter.   Medications Discontinued During This Encounter  Medication Reason   Vitamin D, Ergocalciferol, (DRISDOL) 1.25 MG (50000 UNIT) CAPS capsule Reorder     Meds ordered this encounter  Medications   Vitamin D, Ergocalciferol, (DRISDOL) 1.25 MG (50000 UNIT) CAPS capsule    Sig: Take 1 capsule (50,000 Units total) by mouth every 7 (seven) days.    Dispense:  4 capsule    Refill:  0    30 d supply;  ** OV for RF **   Do not send RF request     1. Vitamin D deficiency Low Vitamin D level contributes to fatigue and are associated with obesity, breast, and colon cancer. She agrees to continue to take prescription Vitamin D '@50'$ ,000 IU every week and will follow-up  for routine testing of Vitamin D, at least 2-3 times per year to avoid over-replacement.  Refill- Vitamin D, Ergocalciferol, (DRISDOL) 1.25 MG (50000 UNIT) CAPS capsule; Take 1 capsule (50,000 Units total) by mouth every 7 (seven) days.  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  Encourage patient to follow-up with Dr. Mallie Mussel.  Strategies for dealing with emotional eating discussed with patient.  3. Obesity, current BMI 40.6 She will give her notice of work and will quit January 1.  Her goal is to not gain over the holidays.  Whitney Foster is currently in the action stage of change. As such, her goal is to continue with weight loss efforts. She has agreed to the [place].   Exercise goals:  As is.  Behavioral modification strategies: emotional eating strategies, will consider changing to journaling in January 2024  Whitney Foster has agreed to follow-up with our clinic in 4-5 weeks. She was informed of the importance of frequent follow-up visits to maximize her success with intensive lifestyle modifications for her multiple health conditions.   Objective:   Blood pressure 134/83, pulse 81, temperature 97.7 F (36.5 C), height '5\' 2"'$  (1.575 m), weight 222 lb 3.2 oz (100.8 kg), last menstrual period 04/14/2013, SpO2 100 %. Body mass index is 40.64 kg/m.  General: Cooperative, alert, well developed, in no acute distress. HEENT: Conjunctivae and lids unremarkable. Cardiovascular: Regular rhythm.  Lungs: Normal work of  breathing. Neurologic: No focal deficits.   Lab Results  Component Value Date   CREATININE 0.74 10/08/2012   BUN 8 10/08/2012   NA 139 10/08/2012   K 3.9 10/08/2012   CL 107 10/08/2012   CO2 24 10/08/2012   Lab Results  Component Value Date   ALT 8 10/08/2012   AST 11 10/08/2012   ALKPHOS 76 10/08/2012   BILITOT 0.2 (L) 10/08/2012   Lab Results  Component Value Date   HGBA1C 5.8 (H)  03/19/2022   Lab Results  Component Value Date   INSULIN 8.0 03/19/2022   No results found for: "TSH" No results found for: "CHOL", "HDL", "LDLCALC", "LDLDIRECT", "TRIG", "CHOLHDL" Lab Results  Component Value Date   VD25OH 14.5 (L) 03/19/2022   Lab Results  Component Value Date   WBC 7.2 10/09/2012   HGB 9.5 (L) 10/09/2012   HCT 30.4 (L) 10/09/2012   MCV 67.3 (L) 10/09/2012   PLT 334 10/09/2012   Lab Results  Component Value Date   IRON 17 (L) 10/08/2012   TIBC 444 10/08/2012   FERRITIN 5 (L) 10/08/2012   Attestation Statements:   Reviewed by clinician on day of visit: allergies, medications, problem list, medical history, surgical history, family history, social history, and previous encounter notes.  I, Davy Pique, RMA, am acting as Location manager for [employer], DO.  I have reviewed the above documentation for accuracy and completeness, and I agree with the above. [name], D.O.  The [place] was signed into law in 2016 which includes the topic of electronic health records.  This provides immediate access to information in MyChart.  This includes consultation notes, operative notes, office notes, lab results and pathology reports.  If you have any questions about what you read please let us know at your next visit so we can discuss your concerns and take corrective action if need be.  We are right here with you.

## 2022-08-12 ENCOUNTER — Ambulatory Visit (INDEPENDENT_AMBULATORY_CARE_PROVIDER_SITE_OTHER): Payer: 59 | Admitting: Family Medicine

## 2022-08-12 ENCOUNTER — Encounter (INDEPENDENT_AMBULATORY_CARE_PROVIDER_SITE_OTHER): Payer: Self-pay | Admitting: Family Medicine

## 2022-08-12 VITALS — BP 149/88 | HR 93 | Temp 97.4°F | Ht 62.0 in | Wt 223.0 lb

## 2022-08-12 DIAGNOSIS — I10 Essential (primary) hypertension: Secondary | ICD-10-CM | POA: Diagnosis not present

## 2022-08-12 DIAGNOSIS — R7303 Prediabetes: Secondary | ICD-10-CM | POA: Diagnosis not present

## 2022-08-12 DIAGNOSIS — Z6841 Body Mass Index (BMI) 40.0 and over, adult: Secondary | ICD-10-CM

## 2022-08-12 DIAGNOSIS — E538 Deficiency of other specified B group vitamins: Secondary | ICD-10-CM | POA: Diagnosis not present

## 2022-08-12 DIAGNOSIS — E669 Obesity, unspecified: Secondary | ICD-10-CM

## 2022-08-12 DIAGNOSIS — E559 Vitamin D deficiency, unspecified: Secondary | ICD-10-CM

## 2022-08-12 MED ORDER — METFORMIN HCL 500 MG PO TABS: ORAL_TABLET | ORAL | 0 refills | Status: DC

## 2022-08-12 MED ORDER — VITAMIN D (ERGOCALCIFEROL) 1.25 MG (50000 UNIT) PO CAPS: 50000.0000 [IU] | ORAL_CAPSULE | ORAL | 0 refills | Status: DC

## 2022-08-27 LAB — BASIC METABOLIC PANEL
BUN/Creatinine Ratio: 13 (ref 12–28)
BUN: 11 mg/dL (ref 8–27)
CO2: 26 mmol/L (ref 20–29)
Calcium: 9.5 mg/dL (ref 8.7–10.3)
Chloride: 108 mmol/L — ABNORMAL HIGH (ref 96–106)
Creatinine, Ser: 0.88 mg/dL (ref 0.57–1.00)
Glucose: 89 mg/dL (ref 70–99)
Potassium: 3.7 mmol/L (ref 3.5–5.2)
Sodium: 144 mmol/L (ref 134–144)
eGFR: 74 mL/min/{1.73_m2} (ref >59)

## 2022-08-27 LAB — VITAMIN D 25 HYDROXY (VIT D DEFICIENCY, FRACTURES): Vit D, 25-Hydroxy: 19.1 ng/mL — ABNORMAL LOW (ref 30.0–100.0)

## 2022-08-27 LAB — MAGNESIUM: Magnesium: 1.8 mg/dL (ref 1.6–2.3)

## 2022-08-27 LAB — HEMOGLOBIN A1C
Est. average glucose Bld gHb Est-mCnc: 126 mg/dL
Hgb A1c MFr Bld: 6 % — ABNORMAL HIGH (ref 4.8–5.6)

## 2022-08-27 LAB — VITAMIN B12: Vitamin B-12: 500 pg/mL (ref 232–1245)

## 2022-08-27 LAB — INSULIN, RANDOM: INSULIN: 11.3 u[IU]/mL (ref 2.6–24.9)

## 2022-08-28 ENCOUNTER — Encounter (INDEPENDENT_AMBULATORY_CARE_PROVIDER_SITE_OTHER): Payer: Self-pay | Admitting: Family Medicine

## 2022-08-28 ENCOUNTER — Ambulatory Visit (INDEPENDENT_AMBULATORY_CARE_PROVIDER_SITE_OTHER): Payer: 59 | Admitting: Family Medicine

## 2022-08-28 VITALS — BP 140/86 | HR 83 | Temp 97.8°F | Ht 62.0 in | Wt 227.6 lb

## 2022-08-28 DIAGNOSIS — Z6841 Body Mass Index (BMI) 40.0 and over, adult: Secondary | ICD-10-CM

## 2022-08-28 DIAGNOSIS — E559 Vitamin D deficiency, unspecified: Secondary | ICD-10-CM

## 2022-08-28 DIAGNOSIS — E669 Obesity, unspecified: Secondary | ICD-10-CM

## 2022-08-28 DIAGNOSIS — R7303 Prediabetes: Secondary | ICD-10-CM | POA: Diagnosis not present

## 2022-08-28 DIAGNOSIS — I10 Essential (primary) hypertension: Secondary | ICD-10-CM | POA: Diagnosis not present

## 2022-08-28 DIAGNOSIS — E538 Deficiency of other specified B group vitamins: Secondary | ICD-10-CM | POA: Diagnosis not present

## 2022-08-28 MED ORDER — VITAMIN D (ERGOCALCIFEROL) 1.25 MG (50000 UNIT) PO CAPS: 50000.0000 [IU] | ORAL_CAPSULE | ORAL | 0 refills | Status: DC

## 2022-08-29 NOTE — Progress Notes (Signed)
Chief Complaint:   OBESITY Whitney Foster is here to discuss her progress with her obesity treatment plan along with follow-up of her obesity related diagnoses. Whitney Foster is on the [place] and states she is following her eating plan approximately 50% of the time. Whitney Foster states she is home video 15-20 minutes 4 times per week.  Today's visit was #: 6 Starting weight: 220 LBS Starting date: 03/19/2022 Today's weight: 223 LBS Today's date: 08/12/2022 Total lbs lost to date: 0 Total lbs lost since last in-office visit: +1 LB  Interim History: " I need to get back on meal plan I have not been consistent."  Patient recently had a birthday in a week and celebrations, eating.  Patient ate [place], fish, chicken.  Subjective:   1. Prediabetes Patient is not taking metformin 500 mg daily.  2. Vitamin D deficiency Whitney Foster is tolerating medication(s) well without side effects.  Medication compliance is good as patient endorses taking it as prescribed.  Symptoms are stable and the patient denies additional concerns regarding this condition.  Patient is taken ergocalciferol 50,000 IUs weekly.  3. Essential hypertension Patient has not checked her blood pressure in a couple of weeks at home.  Patient denies headache or any concerns.  Patient is taking her medication regularly.  4. B12 deficiency Patient is taking OTC B12 500 mcg daily.  Patient is positive for fatigue on and off.  Assessment/Plan:   Orders Placed This Encounter  Procedures   Vitamin B12   VITAMIN D 25 Hydroxy (Vit-D Deficiency, Fractures)   Magnesium   Basic metabolic panel   Insulin, random   Hemoglobin A1c    Medications Discontinued During This Encounter  Medication Reason   metFORMIN (GLUCOPHAGE) 500 MG tablet Reorder   Vitamin D, Ergocalciferol, (DRISDOL) 1.25 MG (50000 UNIT) CAPS capsule Reorder     Meds ordered this encounter  Medications   metFORMIN (GLUCOPHAGE) 500 MG tablet    Sig: 1 po  with lunch daily    Dispense:  30 tablet    Refill:  0    30 d supply;  ** OV for RF **   Do not send RF request   DISCONTD: Vitamin D, Ergocalciferol, (DRISDOL) 1.25 MG (50000 UNIT) CAPS capsule    Sig: Take 1 capsule (50,000 Units total) by mouth every 7 (seven) days.    Dispense:  4 capsule    Refill:  0    30 d supply;  ** OV for RF **   Do not send RF request     1. Prediabetes Check labs today.  - Insulin, random - Hemoglobin A1c  Refill- metFORMIN (GLUCOPHAGE) 500 MG tablet; 1 po with lunch daily  Dispense: 30 tablet; Refill: 0  2. Vitamin D deficiency Low Vitamin D level contributes to fatigue and are associated with obesity, breast, and colon cancer. She agrees to continue to take prescription Vitamin D '@50'$ ,000 IU every week and will follow-up for routine testing of Vitamin D, at least 2-3 times per year to avoid over-replacement.  Check labs today.  A refill ergocalciferol 50,000 IUs weekly #4, 0 refill.  - VITAMIN D 25 Hydroxy (Vit-D Deficiency, Fractures)  3. Essential hypertension Blood pressure not at goal today.  Continue Hyzaar and decrease salt intake.  Check labs today.  - Magnesium - Basic metabolic panel  4. B12 deficiency Check labs today.  Continue OTC B12 and PNP.  - Vitamin B12  5. Obesity, current BMI 40.8 Reviewed category 2 and vegetarian  as a guide the patient will journal intake.  Keep a log daily.  Whitney Foster is currently in the action stage of change. As such, her goal is to continue with weight loss efforts. She has agreed to keeping a food journal and adhering to recommended goals of 1200-1300 calories and 80+ protein and the Vegetarian Plan daily.    Exercise goals:  Increase activity as tolerated.  Behavioral modification strategies: increasing lean protein intake and planning for success.  Whitney Foster has agreed to follow-up with our clinic in 2-3 weeks. She was informed of the importance of frequent follow-up visits to maximize her success  with intensive lifestyle modifications for her multiple health conditions.   Objective:   Blood pressure (!) 149/88, pulse 93, temperature (!) 97.4 F (36.3 C), height '5\' 2"'$  (1.575 m), weight 223 lb (101.2 kg), last menstrual period 04/14/2013, SpO2 98 %. Body mass index is 40.79 kg/m.  General: Cooperative, alert, well developed, in no acute distress. HEENT: Conjunctivae and lids unremarkable. Cardiovascular: Regular rhythm.  Lungs: Normal work of breathing. Neurologic: No focal deficits.   Lab Results  Component Value Date   CREATININE 0.88 08/26/2022   BUN 11 08/26/2022   NA 144 08/26/2022   K 3.7 08/26/2022   CL 108 (H) 08/26/2022   CO2 26 08/26/2022   Lab Results  Component Value Date   ALT 8 10/08/2012   AST 11 10/08/2012   ALKPHOS 76 10/08/2012   BILITOT 0.2 (L) 10/08/2012   Lab Results  Component Value Date   HGBA1C 6.0 (H) 08/26/2022   HGBA1C 5.8 (H) 03/19/2022   Lab Results  Component Value Date   INSULIN 11.3 08/26/2022   INSULIN 8.0 03/19/2022   No results found for: "TSH" No results found for: "CHOL", "HDL", "LDLCALC", "LDLDIRECT", "TRIG", "CHOLHDL" Lab Results  Component Value Date   VD25OH 19.1 (L) 08/26/2022   VD25OH 14.5 (L) 03/19/2022   Lab Results  Component Value Date   WBC 7.2 10/09/2012   HGB 9.5 (L) 10/09/2012   HCT 30.4 (L) 10/09/2012   MCV 67.3 (L) 10/09/2012   PLT 334 10/09/2012   Lab Results  Component Value Date   IRON 17 (L) 10/08/2012   TIBC 444 10/08/2012   FERRITIN 5 (L) 10/08/2012   Attestation Statements:   Reviewed by clinician on day of visit: allergies, medications, problem list, medical history, surgical history, family history, social history, and previous encounter notes.  I, Davy Pique, RMA, am acting as Location manager for [employer], DO.   I have reviewed the above documentation for accuracy and completeness, and I agree with the above. [name], D.O.  The [place] was  signed into law in 2016 which includes the topic of electronic health records.  This provides immediate access to information in MyChart.  This includes consultation notes, operative notes, office notes, lab results and pathology reports.  If you have any questions about what you read please let us know at your next visit so we can discuss your concerns and take corrective action if need be.  We are right here with you.

## 2022-09-02 ENCOUNTER — Ambulatory Visit (INDEPENDENT_AMBULATORY_CARE_PROVIDER_SITE_OTHER): Payer: 59 | Admitting: Family Medicine

## 2022-09-16 ENCOUNTER — Ambulatory Visit (INDEPENDENT_AMBULATORY_CARE_PROVIDER_SITE_OTHER): Payer: 59 | Admitting: Family Medicine

## 2022-09-16 NOTE — Progress Notes (Unsigned)
Chief Complaint:   OBESITY [name] is here to discuss her progress with her obesity treatment plan along with follow-up of her obesity related diagnoses. [name] is on the Category 2 Plan and keeping a food journal and adhering to recommended goals of 1200-1300 calories and 80 protein and states she is following her eating plan approximately 50-60% of the time. [name] states she is not exercising.   Today's visit was #: 7 Starting weight: 220 lbs Starting date: 03/19/2022 Today's weight: 227 lbs Today's date: 08/28/2022 Total lbs lost to date: 0 Total lbs lost since last in-office visit: +4 lbs  Interim History: Diagnosis with reflux yesterday and was given pantoprazole, famotidine by her GI doc.  She has not started them yet.  Patient was asked to journal and was not able to do so.  Patient endorses she has been eating poorly.   Subjective:   1. Essential hypertension Discussed labs with patient today. Has not taken blood pressure medication today.  She did not check blood pressure at home as recommended.   2. Prediabetes Worsening. Discussed labs with patient today. Has not taken any metformin in about three weeks. She has plenty at home.  Fasting insulin was 8.0, has increased to 11.3.  A1c worse from 5.8, five months ago and is now 6.0.  3. Vitamin D deficiency Discussed labs with patient today. Whitney Foster is tolerating medication(s) well without side effects.  Medication compliance is good as patient endorses taking it as prescribed.  Symptoms are stable and the patient denies additional concerns regarding this condition.  Last Vitamin D level 14.5 to 19.1.  Patient  admits to not being consistent with intake weekly.    4. B12 deficiency Discussed labs with patient today. Patient takes 250 mcg occasionally. Patient is taking 200-500 mcg OTC daily.   Assessment/Plan:  No orders of the defined types were placed in this encounter.   Medications Discontinued  During This Encounter  Medication Reason   Vitamin D, Ergocalciferol, (DRISDOL) 1.25 MG (50000 UNIT) CAPS capsule Reorder     Meds ordered this encounter  Medications   Vitamin D, Ergocalciferol, (DRISDOL) 1.25 MG (50000 UNIT) CAPS capsule    Sig: Take 1 capsule (50,000 Units total) by mouth every 7 (seven) days.    Dispense:  4 capsule    Refill:  0    30 d supply;  ** OV for RF **   Do not send RF request     1. Essential hypertension Blood pressure not at goal, too dry as indicated by increased sodium chloride levels.  Increase water intake.   2. Prediabetes Worsening, A1c and fasting insulin. Needs weight loss via PNP.   3. Vitamin D deficiency Low Vitamin D level contributes to fatigue and are associated with obesity, breast, and colon cancer. She agrees to continue to take prescription Vitamin D @50$ ,000 IU every week and will follow-up for routine testing of Vitamin D, at least 2-3 times per year to avoid over-replacement.  Refill - Vitamin D, Ergocalciferol, (DRISDOL) 1.25 MG (50000 UNIT) CAPS capsule; Take 1 capsule (50,000 Units total) by mouth every 7 (seven) days.  Dispense: 4 capsule; Refill: 0  4. B12 deficiency B12 at goal of 500 mcg or more, but be more consistent.   5. Obesity, current BMI 41.6 She will exhibit setting boundaries with others and say no on two different occasions before next office visit.  Call Dr Mallie Mussel for follow up appointment.  Bring in blood pressure log.  Bring in log from journaling foods.   [name] is currently in the action stage of change. As such, her goal is to continue with weight loss efforts. She has agreed to the Category 2 Plan and keeping a food journal and adhering to recommended goals of 1200-1300 calories and 80+ protein.   Exercise goals: All adults should avoid inactivity. Some physical activity is better than none, and adults who participate in any amount of physical activity gain some health benefits.  Behavioral  modification strategies: meal planning and cooking strategies and planning for success.  [name] has agreed to follow-up with our clinic in 4 weeks. She was informed of the importance of frequent follow-up visits to maximize her success with intensive lifestyle modifications for her multiple health conditions.   Objective:   Blood pressure (!) 140/86, pulse 83, temperature 97.8 F (36.6 C), height 5' 2"$  (1.575 m), weight 227 lb 9.6 oz (103.2 kg), last menstrual period 04/14/2013, SpO2 100 %. Body mass index is 41.63 kg/m.  General: Cooperative, alert, well developed, in no acute distress. HEENT: Conjunctivae and lids unremarkable. Cardiovascular: Regular rhythm.  Lungs: Normal work of breathing. Neurologic: No focal deficits.   Lab Results  Component Value Date   CREATININE 0.88 08/26/2022   BUN 11 08/26/2022   NA 144 08/26/2022   K 3.7 08/26/2022   CL 108 (H) 08/26/2022   CO2 26 08/26/2022   Lab Results  Component Value Date   ALT 8 10/08/2012   AST 11 10/08/2012   ALKPHOS 76 10/08/2012   BILITOT 0.2 (L) 10/08/2012   Lab Results  Component Value Date   HGBA1C 6.0 (H) 08/26/2022   HGBA1C 5.8 (H) 03/19/2022   Lab Results  Component Value Date   INSULIN 11.3 08/26/2022   INSULIN 8.0 03/19/2022   No results found for: "TSH" No results found for: "CHOL", "HDL", "LDLCALC", "LDLDIRECT", "TRIG", "CHOLHDL" Lab Results  Component Value Date   VD25OH 19.1 (L) 08/26/2022   VD25OH 14.5 (L) 03/19/2022   Lab Results  Component Value Date   WBC 7.2 10/09/2012   HGB 9.5 (L) 10/09/2012   HCT 30.4 (L) 10/09/2012   MCV 67.3 (L) 10/09/2012   PLT 334 10/09/2012   Lab Results  Component Value Date   IRON 17 (L) 10/08/2012   TIBC 444 10/08/2012   FERRITIN 5 (L) 10/08/2012   Attestation Statements:   Reviewed by clinician on day of visit: allergies, medications, problem list, medical history, surgical history, family history, social history, and previous encounter  notes.  I, Davy Pique, RMA, am acting as Location manager for [employer], DO.   I have reviewed the above documentation for accuracy and completeness, and I agree with the above. Whitney Foster, D.O.  The [place] was signed into law in 2016 which includes the topic of electronic health records.  This provides immediate access to information in MyChart.  This includes consultation notes, operative notes, office notes, lab results and pathology reports.  If you have any questions about what you read please let us know at your next visit so we can discuss your concerns and take corrective action if need be.  We are right here with you.

## 2022-10-02 ENCOUNTER — Ambulatory Visit (INDEPENDENT_AMBULATORY_CARE_PROVIDER_SITE_OTHER): Payer: 59 | Admitting: Family Medicine

## 2022-10-25 ENCOUNTER — Ambulatory Visit: Payer: 59 | Attending: Student | Admitting: Nurse Practitioner

## 2022-10-25 ENCOUNTER — Encounter: Payer: Self-pay | Admitting: Nurse Practitioner

## 2022-10-25 VITALS — BP 112/70 | HR 88 | Ht 62.0 in | Wt 232.0 lb

## 2022-10-25 DIAGNOSIS — G4733 Obstructive sleep apnea (adult) (pediatric): Secondary | ICD-10-CM

## 2022-10-25 DIAGNOSIS — R0789 Other chest pain: Secondary | ICD-10-CM

## 2022-10-25 DIAGNOSIS — I10 Essential (primary) hypertension: Secondary | ICD-10-CM | POA: Diagnosis not present

## 2022-10-25 DIAGNOSIS — R0609 Other forms of dyspnea: Secondary | ICD-10-CM

## 2022-10-25 NOTE — Patient Instructions (Signed)
Medication Instructions:  Your physician recommends that you continue on your current medications as directed. Please refer to the Current Medication list given to you today.   *If you need a refill on your cardiac medications before your next appointment, please call your pharmacy*   Lab Work: NONE ordered at this time of appointment   If you have labs (blood work) drawn today and your tests are completely normal, you will receive your results only by: MyChart Message (if you have MyChart) OR A paper copy in the mail If you have any lab test that is abnormal or we need to change your treatment, we will call you to review the results.   Testing/Procedures: NONE ordered at this time of appointment     Follow-Up: At [facility], you and your health needs are our priority.  As part of our continuing mission to provide you with exceptional heart care, we have created designated Provider Care Teams.  These Care Teams include your primary Cardiologist (physician) and Advanced Practice Providers (APPs -  Physician Assistants and Nurse Practitioners) who all work together to provide you with the care you need, when you need it.  We recommend signing up for the patient portal called "MyChart".  Sign up information is provided on this After Visit Summary.  MyChart is used to connect with patients for Virtual Visits (Telemedicine).  Patients are able to view lab/test results, encounter notes, upcoming appointments, etc.  Non-urgent messages can be sent to your provider as well.   To learn more about what you can do with MyChart, go to [email or web address].    Your next appointment:   3-4 month(s)  Provider:   Emily Monge, NP        Other Instructions   

## 2022-10-25 NOTE — Progress Notes (Unsigned)
Office Visit    Patient Name: Whitney Foster Date of Encounter: 10/25/2022  Primary Care Provider:  Seward Carol, MD Primary Cardiologist:  Quay Burow, MD  Chief Complaint    62 year old female with a history of atypical chest pain, dyspnea on exertion, hypertension, lower extremity edema, OSA, and GERD who presents for follow-up related to chest pain.  Past Medical History    Past Medical History:  Diagnosis Date   Anemia    secondary to heavy menses- transfused in the past   Back pain    Edema of both lower extremities    Fibroids    "2000"   GERD (gastroesophageal reflux disease)    Hip pain    Hypertension    Joint pain    Obesity    BMI 41   Pre-diabetes    Sciatic pain    Sleep apnea    Vitamin D deficiency    Past Surgical History:  Procedure Laterality Date   BREAST EXCISIONAL BIOPSY Left    CARPAL TUNNEL RELEASE N/A    CESAREAN SECTION     "1998"   MYOMECTOMY ABDOMINAL APPROACH     1994   MYOMECTOMY VAGINAL APPROACH     1997   vocal nodules      Allergies  Allergies  Allergen Reactions   Hydrocodone-Acetaminophen     Other reaction(s): GI Upset (intolerance)     Labs/Other Studies Reviewed    The following studies were reviewed today: CT cardiac scoring 01/2021: FINDINGS: Coronary Calcium Score:   Left main: 0   Left anterior descending artery: 0   Left circumflex artery: 0   Right coronary artery: 0   Total: 0   Percentile: 0   Pericardium: Normal.   Ascending Aorta: Normal caliber.   Non-cardiac: See separate report from [facility].   IMPRESSION: Coronary calcium score of 0. This was 0 percentile for age-, race-, and sex-matched controls.  Echo 2019: Study Conclusions   - Left ventricle: The cavity size was normal. There was mild    concentric hypertrophy. Systolic function was normal. The    estimated ejection fraction was in the range of 55% to 60%. Wall    motion was normal; there were no  regional wall motion    abnormalities. Left ventricular diastolic function parameters    were normal.  - Tricuspid valve: There was trivial regurgitation.  - Pulmonic valve: There was mild regurgitation.  Cardiac monitor 09/2016: Monitor shows sinus rhythm and sinus tachycardia. No extrasystoles or a-fib.   Recent Labs: 08/26/2022: BUN 11; Creatinine, Ser 0.88; Magnesium 1.8; Potassium 3.7; Sodium 144  Recent Lipid Panel No results found for: "CHOL", "TRIG", "HDL", "CHOLHDL", "VLDL", "LDLCALC", "LDLDIRECT"  History of Present Illness    62 year old female with the above past medical history including atypical chest pain, dyspnea on exertion, hypertension, lower extremity edema, OSA, and GERD.  She was referred to cardiology by her PCP in the setting of atypical chest pain.  She previously saw Dr. Debara Pickett, follows with Dr. Claiborne Billings for OSA.  Most recently saw Dr. Gwenlyn Found on 12/05/2020.  Echocardiogram in 2019 was normal.  Cardiac monitor in 2018 showed sinus rhythm, sinus tachycardia, no significant arrhythmia.  At her last follow-up visit she noted new onset left upper extremity pain, the pain occurred mostly when she was lying down.  Follow-up coronary calcium score for further restratification was 0, no evidence of CAD.   She presents today for follow-up. Since her last visit has been stable overall from  a cardiac standpoint.  Over the past 2 weeks she has noted a fluttering, soreness in her chest that occurs with certain movements but only at rest.  She denies any exertional symptoms.  Denies dyspnea, edema, PND, orthopnea, waking.  Denies any palpitations.  She wonders if this could be musculoskeletal or related to GERD.  Symptoms are atypical for angina.  Also with very reassuring prior evaluation.  I think her symptoms are most likely musculoskeletal.  Discussed ED precautions.  Advised her to notify us if she has exertional symptoms, more frequent or persistent symptoms.  Tender to palpation.  EKG  today stable.  Encouraged ongoing efforts towards weight loss.  We discussed possible GLP-1 receptor agonist therapy.  She states she will plans to discuss this with her PCP.  Follow-up in 3 to 4 months.  Does have osteoarthritis, and needs a hip replacement but needs to lose weight prior to this.  Atypical chest pain: Dyspnea on exertion: Hypertension: OSA: Encouraged adherence to CPAP.  Disposition:  Home Medications    Current Outpatient Medications  Medication Sig Dispense Refill   cetirizine (ZYRTEC) 10 MG tablet Take 10 mg by mouth daily.     cyanocobalamin (VITAMIN B12) 500 MCG tablet 200-500 mcg daily otc     ibuprofen (ADVIL,MOTRIN) 200 MG tablet Take 200 mg by mouth every 6 (six) hours as needed for pain.     losartan-hydrochlorothiazide (HYZAAR) 50-12.5 MG per tablet Take 1 tablet by mouth daily.     meloxicam (MOBIC) 15 MG tablet Take 15 mg by mouth daily.     metFORMIN (GLUCOPHAGE) 500 MG tablet 1 po with lunch daily 30 tablet 0   Vitamin D, Ergocalciferol, (DRISDOL) 1.25 MG (50000 UNIT) CAPS capsule Take 1 capsule (50,000 Units total) by mouth every 7 (seven) days. 4 capsule 0   No current facility-administered medications for this visit.     Review of Systems    ***.  All other systems reviewed and are otherwise negative except as noted above.    Physical Exam    VS:  BP 112/70   Pulse 88   Ht 5\' 2"  (1.575 m)   Wt 232 lb (105.2 kg)   LMP 04/14/2013   SpO2 98%   BMI 42.43 kg/m   GEN: Well nourished, well developed, in no acute distress. HEENT: normal. Neck: Supple, no JVD, carotid bruits, or masses. Cardiac: RRR, no murmurs, rubs, or gallops. No clubbing, cyanosis, edema.  Radials/DP/PT 2+ and equal bilaterally.  Respiratory:  Respirations regular and unlabored, clear to auscultation bilaterally. GI: Soft, nontender, nondistended, BS + x 4. MS: no deformity or atrophy. Skin: warm and dry, no rash. Neuro:  Strength and sensation are intact. Psych: Normal  affect.  Accessory Clinical Findings    ECG personally reviewed by me today -NSR, 94 bpm- no acute changes.   Lab Results  Component Value Date   WBC 7.2 10/09/2012   HGB 9.5 (L) 10/09/2012   HCT 30.4 (L) 10/09/2012   MCV 67.3 (L) 10/09/2012   PLT 334 10/09/2012   Lab Results  Component Value Date   CREATININE 0.88 08/26/2022   BUN 11 08/26/2022   NA 144 08/26/2022   K 3.7 08/26/2022   CL 108 (H) 08/26/2022   CO2 26 08/26/2022   Lab Results  Component Value Date   ALT 8 10/08/2012   AST 11 10/08/2012   ALKPHOS 76 10/08/2012   BILITOT 0.2 (L) 10/08/2012   No results found for: "CHOL", "HDL", "LDLCALC", "LDLDIRECT", "TRIG", "  CHOLHDL"  Lab Results  Component Value Date   HGBA1C 6.0 (H) 08/26/2022    Assessment & Plan    1.  ***      Lenna Sciara, NP 10/25/2022, 5:06 PM

## 2022-10-27 ENCOUNTER — Encounter: Payer: Self-pay | Admitting: Nurse Practitioner

## 2022-11-16 ENCOUNTER — Emergency Department (HOSPITAL_COMMUNITY): Payer: 59

## 2022-11-16 ENCOUNTER — Emergency Department (HOSPITAL_COMMUNITY)
Admission: EM | Admit: 2022-11-16 | Discharge: 2022-11-16 | Disposition: A | Discharge status: Home / Self Care | Payer: 59 | Attending: Emergency Medicine | Admitting: Emergency Medicine

## 2022-11-16 ENCOUNTER — Encounter (HOSPITAL_COMMUNITY): Payer: Self-pay

## 2022-11-16 DIAGNOSIS — M25552 Pain in left hip: Secondary | ICD-10-CM | POA: Diagnosis not present

## 2022-11-16 MED ORDER — ACETAMINOPHEN 325 MG PO TABS: 650.0000 mg | ORAL_TABLET | Freq: Four times a day (QID) | ORAL | 0 refills | Status: DC | PRN

## 2022-11-16 MED ORDER — CYCLOBENZAPRINE HCL 10 MG PO TABS: 10.0000 mg | ORAL_TABLET | Freq: Two times a day (BID) | ORAL | 0 refills | Status: DC | PRN

## 2022-11-16 MED ORDER — CYCLOBENZAPRINE HCL 10 MG PO TABS
10.0000 mg | ORAL_TABLET | Freq: Once | ORAL | Status: AC
  Administered 2022-11-16: 10 mg via ORAL
  Filled 2022-11-16: qty 1

## 2022-11-16 MED ORDER — IBUPROFEN 600 MG PO TABS: 600.0000 mg | ORAL_TABLET | Freq: Three times a day (TID) | ORAL | 0 refills | Status: DC | PRN

## 2022-11-16 MED ORDER — IBUPROFEN 200 MG PO TABS
600.0000 mg | ORAL_TABLET | Freq: Once | ORAL | Status: AC
  Administered 2022-11-16: 600 mg via ORAL
  Filled 2022-11-16: qty 3

## 2022-11-16 MED ORDER — ACETAMINOPHEN 325 MG PO TABS
650.0000 mg | ORAL_TABLET | Freq: Once | ORAL | Status: AC
  Administered 2022-11-16: 650 mg via ORAL
  Filled 2022-11-16: qty 2

## 2022-11-16 NOTE — ED Provider Notes (Signed)
[place] EMERGENCY DEPARTMENT AT [facility] Provider Note   CSN: [id number] Arrival date & time: 11/16/22  0017     History  Chief Complaint  Patient presents with   Whitney Foster    Whitney Foster is a 62 y.o. female presenting to the emergency department with left hip pain.  Patient reports he had a mechanical fall yesterday and landed on her left hip.  She felt that she did a split in the sense that she hyperextended her left lower leg, and is now having a sharp pulling pain along the backside of her left leg.  She is able to bear weight with a walker.  HPI     Home Medications Prior to Admission medications   Medication Sig Start Date End Date Taking? Authorizing Provider  acetaminophen (TYLENOL) 325 MG tablet Take 2 tablets (650 mg total) by mouth every 6 (six) hours as needed for up to 30 doses for moderate pain or mild pain. 11/16/22  Yes Caelyn Route, Kermit Balo, MD  cyclobenzaprine (FLEXERIL) 10 MG tablet Take 1 tablet (10 mg total) by mouth 2 (two) times daily as needed for up to 14 doses for muscle spasms. 11/16/22  Yes Lorain Fettes, Kermit Balo, MD  ibuprofen (ADVIL) 600 MG tablet Take 1 tablet (600 mg total) by mouth every 8 (eight) hours as needed for up to 30 doses for mild pain or moderate pain. 11/16/22  Yes Terald Sleeper, MD  cetirizine (ZYRTEC) 10 MG tablet Take 10 mg by mouth daily.    [provider]  cyanocobalamin (VITAMIN B12) 500 MCG tablet 200-500 mcg daily otc 04/02/22   Opalski, Gavin Pound, DO  ibuprofen (ADVIL,MOTRIN) 200 MG tablet Take 200 mg by mouth every 6 (six) hours as needed for pain.    [provider]  losartan-hydrochlorothiazide (HYZAAR) 50-12.5 MG per tablet Take 1 tablet by mouth daily.    [provider]  meloxicam (MOBIC) 15 MG tablet Take 15 mg by mouth daily.    [provider]  metFORMIN (GLUCOPHAGE) 500 MG tablet 1 po with lunch daily 08/12/22   Opalski, Gavin Pound, DO  Vitamin D, Ergocalciferol, (DRISDOL) 1.25 MG  (50000 UNIT) CAPS capsule Take 1 capsule (50,000 Units total) by mouth every 7 (seven) days. 08/28/22   Thomasene Lot, DO      Allergies    Hydrocodone-acetaminophen    Review of Systems   Review of Systems  Physical Exam Updated Vital Signs BP (!) 132/104 (BP Location: Right Arm)   Pulse 98   Temp 97.9 F (36.6 C) (Oral)   Resp 18   Ht 5\' 2"  (1.575 m)   Wt 102.1 kg   LMP 04/14/2013   SpO2 100%   BMI 41.15 kg/m  Physical Exam Constitutional:      General: She is not in acute distress. HENT:     Head: Normocephalic and atraumatic.  Eyes:     Conjunctiva/sclera: Conjunctivae normal.     Pupils: Pupils are equal, round, and reactive to light.  Cardiovascular:     Rate and Rhythm: Normal rate and regular rhythm.  Pulmonary:     Effort: Pulmonary effort is normal. No respiratory distress.  Musculoskeletal:     Comments: Focal tenderness tracking along the hamstring or the posterior aspect of the left leg, worse with left leg extension, no visible deformity of the left hip, patient is able to bear weight with an ambulator  Skin:    General: Skin is warm and dry.  Neurological:  General: No focal deficit present.     Mental Status: She is alert. Mental status is at baseline.  Psychiatric:        Mood and Affect: Mood normal.        Behavior: Behavior normal.     ED Results / Procedures / Treatments   Labs (all labs ordered are listed, but only abnormal results are displayed) Labs Reviewed - No data to display  EKG None  Radiology DG Pelvis Portable  Result Date: 11/16/2022 CLINICAL DATA:  fall EXAM: PORTABLE PELVIS 1-2 VIEWS COMPARISON:  None Available. FINDINGS: Severe degenerative changes of the right hip. Mild degenerative changes left hip. Vague lucency along the left femoral intertrochanteric region. No definite acute displaced fracture or dislocation of either hips on frontal view. There is no evidence of pelvic fracture or diastasis. No pelvic bone  lesions are seen. IMPRESSION: 1. Severe degenerative changes of the right hip. 2. Vague lucency along the left femoral intertrochanteric region. Finding likely artifactual. Recommend dedicated 2-3 view radiograph of left hip. 3. Otherwise no definite acute displaced fracture or dislocation. Electronically Signed   By: Tish Frederickson M.D.   On: 11/16/2022 01:29   DG Femur Portable 1 View Left  Result Date: 11/16/2022 CLINICAL DATA:  fall EXAM: LEFT FEMUR PORTABLE 1 VIEW COMPARISON:  None Available. FINDINGS: There is no evidence of fracture or other focal bone lesions. Soft tissues are unremarkable. IMPRESSION: No acute displaced fracture or dislocation with limited evaluation on this single view radiograph. Electronically Signed   By: Tish Frederickson M.D.   On: 11/16/2022 01:28    Procedures Procedures    Medications Ordered in ED Medications  ibuprofen (ADVIL) tablet 600 mg (600 mg Oral Given 11/16/22 0854)  acetaminophen (TYLENOL) tablet 650 mg (650 mg Oral Given 11/16/22 0854)  cyclobenzaprine (FLEXERIL) tablet 10 mg (10 mg Oral Given 11/16/22 7829)    ED Course/ Medical Decision Making/ A&P                             Medical Decision Making Risk OTC drugs. Prescription drug management.   Patient is here after a mechanical fall onto her left hip yesterday.  I suspect this is most likely related to muscle injury or possible hamstring injury given her tenderness on exam.  Have a low suspicion for DVT based on this clinical presentation.  The patient did have x-rays performed overnight, which showed a potential lucency initially along the femur that was not visualized on repeat film.  I evaluated the patient again this morning.  I explained to her there is still a possibility of a smaller hairline fracture based on the x-ray findings, and an MRI would be the neck step of imaging, but the patient does not want to stay in the ED for MRI imaging.  I do think it is reasonable to discharge her  with orthopedic follow-up, she is already a patient with [facility], and however in the meantime use a walker or rollator which she has present with instructions for weightbearing as tolerated.  It is likely that even a small fracture discovered on MRI imaging would be not emergently surgical.  If her symptoms worsen she can return to the ER.  In the meantime I will prescribe muscle relaxers w/ flexeril  Patient confirms that she does take Tylenol at home - not an allergy        Final Clinical Impression(s) / ED Diagnoses Final diagnoses:  Left hip pain    Rx / DC Orders ED Discharge Orders          Ordered    ibuprofen (ADVIL) 600 MG tablet  Every 8 hours PRN        11/16/22 0904    acetaminophen (TYLENOL) 325 MG tablet  Every 6 hours PRN        11/16/22 0904    cyclobenzaprine (FLEXERIL) 10 MG tablet  2 times daily PRN        11/16/22 0904              Terald Sleeper, MD 11/16/22 [phone]

## 2022-11-16 NOTE — Discharge Instructions (Signed)
It is possible that you have an injury to your muscles in the back of the leg, including your hamstring.  It is also possible that you have a hairline fracture or a small fracture of your hip which is not visible on our initial x-rays.  We discussed getting an MRI scan to confirm a possible fracture, but you preferred not to stay for the MRI at this time.  I think it is reasonable for you to follow-up as an outpatient with your orthopedic doctor.  In the meantime I would recommend that you use a walker for extra support until you are seen in the clinic by the orthopedic doctor.  You can take ibuprofen, Tylenol, and Flexeril as needed for pain.

## 2022-11-16 NOTE — ED Triage Notes (Signed)
Pt. Arrives POV for a fall. Pt. States she slipped pt. Denies hitting her head. Pt. Does not take blood thinners. She states that she felt a pull in her left leg and has been in pain since.

## 2022-11-18 ENCOUNTER — Ambulatory Visit: Payer: 59 | Admitting: Student

## 2023-01-07 LAB — LAB REPORT - SCANNED: EGFR: 69

## 2023-01-20 NOTE — Progress Notes (Signed)
Cardiology Clinic Note   Patient Name: Whitney Foster Date of Encounter: 01/27/2023  Primary Care Provider:  Renford Dills, MD Primary Cardiologist:  Nanetta Batty, MD  Patient Profile    62 year old female with a history of atypical chest pain, dyspnea on exertion, hypertension, lower extremity edema, OSA,Obesity, and GERD  with normal cardiac CTA calcium score of 0.    Past Medical History    Past Medical History:  Diagnosis Date   Anemia    secondary to heavy menses- transfused in the past   Back pain    Edema of both lower extremities    Fibroids    "2000"   GERD (gastroesophageal reflux disease)    Hip pain    Hypertension    Joint pain    Obesity    BMI 41   Pre-diabetes    Sciatic pain    Sleep apnea    Vitamin D deficiency    Past Surgical History:  Procedure Laterality Date   BREAST EXCISIONAL BIOPSY Left    CARPAL TUNNEL RELEASE N/A    CESAREAN SECTION     "1998"   MYOMECTOMY ABDOMINAL APPROACH     1994   MYOMECTOMY VAGINAL APPROACH     1997   vocal nodules      Allergies  Allergies  Allergen Reactions   Hydrocodone-Acetaminophen     Other reaction(s): GI Upset (intolerance)    History of Present Illness    Whitney Foster returns today for ongoing assessment of chest pain.  On last office visit with Whitney Person, DNP she was placed on pantoprazole 40 mg daily.  She states she is feeling much better on the PPI, and denies any recurrent discomfort.  She is also part of a weight loss study with Mounjaro, and has lost 13 pounds and is feeling more encouraged to continue eating right and exercising.  She offers no new complaints.  Home Medications    Current Outpatient Medications  Medication Sig Dispense Refill   acetaminophen (TYLENOL) 325 MG tablet Take 2 tablets (650 mg total) by mouth every 6 (six) hours as needed for up to 30 doses for moderate pain or mild pain. 30 tablet 0   cetirizine (ZYRTEC) 10 MG tablet Take 10 mg by mouth daily.      cyanocobalamin (VITAMIN B12) 500 MCG tablet 200-500 mcg daily otc     cyclobenzaprine (FLEXERIL) 10 MG tablet Take 1 tablet (10 mg total) by mouth 2 (two) times daily as needed for up to 14 doses for muscle spasms. 14 tablet 0   ibuprofen (ADVIL) 600 MG tablet Take 1 tablet (600 mg total) by mouth every 8 (eight) hours as needed for up to 30 doses for mild pain or moderate pain. 30 tablet 0   ibuprofen (ADVIL,MOTRIN) 200 MG tablet Take 200 mg by mouth every 6 (six) hours as needed for pain.     losartan-hydrochlorothiazide (HYZAAR) 50-12.5 MG per tablet Take 1 tablet by mouth daily.     meloxicam (MOBIC) 15 MG tablet Take 15 mg by mouth daily.     Vitamin D, Ergocalciferol, (DRISDOL) 1.25 MG (50000 UNIT) CAPS capsule Take 1 capsule (50,000 Units total) by mouth every 7 (seven) days. 4 capsule 0   metFORMIN (GLUCOPHAGE) 500 MG tablet 1 po with lunch daily (Patient not taking: Reported on 01/27/2023) 30 tablet 0   No current facility-administered medications for this visit.     Family History    Family History  Problem Relation Age of Onset  Heart disease Mother 17   Breast cancer Mother    Diabetes Mother    Stroke Mother    High blood pressure Mother    Heart disease Brother    Heart disease Maternal Aunt    She indicated that her mother is alive. She indicated that her brother is deceased. She indicated that her maternal aunt is deceased.  Social History    Social History   Socioeconomic History   Marital status: Married    Spouse name: Brantley   Number of children: Not on file   Years of education: Not on file   Highest education level: Not on file  Occupational History   Occupation: Retired, work part time  Tobacco Use   Smoking status: Never   Smokeless tobacco: Never  Substance and Sexual Activity   Alcohol use: No   Drug use: No   Sexual activity: Yes    Birth control/protection: None  Other Topics Concern   Not on file  Social History Narrative   Not on file    Social Determinants of Health   Financial Resource Strain: Not on file  Food Insecurity: Not on file  Transportation Needs: Not on file  Physical Activity: Not on file  Stress: Not on file  Social Connections: Not on file  Intimate Partner Violence: Not on file     Review of Systems    General:  No chills, fever, night sweats or weight changes.  Cardiovascular:  No chest pain, dyspnea on exertion, edema, orthopnea, palpitations, paroxysmal nocturnal dyspnea. Dermatological: No rash, lesions/masses Respiratory: No cough, dyspnea Urologic: No hematuria, dysuria Abdominal:   No nausea, vomiting, diarrhea, bright red blood per rectum, melena, or hematemesis Neurologic:  No visual changes, wkns, changes in mental status. All other systems reviewed and are otherwise negative except as noted above.     Physical Exam    VS:  BP 134/80 (BP Location: Right Arm, Patient Position: Sitting, Cuff Size: Normal)   Pulse 89   Ht 5\' 2"  (1.575 m)   Wt 224 lb 6.4 oz (101.8 kg)   LMP 04/14/2013   SpO2 97%   BMI 41.04 kg/m  , BMI Body mass index is 41.04 kg/m.     GEN: Well nourished, well developed, in no acute distress. HEENT: normal. Neck: Supple, no JVD, carotid bruits, or masses. Cardiac: RRR, no murmurs, rubs, or gallops. No clubbing, cyanosis, edema.  Radials/DP/PT 2+ and equal bilaterally.  Respiratory:  Respirations regular and unlabored, clear to auscultation bilaterally. GI: Soft, nontender, nondistended, BS + x 4. MS: no deformity or atrophy. Skin: warm and dry, no rash. Neuro:  Strength and sensation are intact. Psych: Normal affect.  Accessory Clinical Findings    Lab Results  Component Value Date   WBC 7.2 10/09/2012   HGB 9.5 (L) 10/09/2012   HCT 30.4 (L) 10/09/2012   MCV 67.3 (L) 10/09/2012   PLT 334 10/09/2012   Lab Results  Component Value Date   CREATININE 0.88 08/26/2022   BUN 11 08/26/2022   NA 144 08/26/2022   K 3.7 08/26/2022   CL 108 (H)  08/26/2022   CO2 26 08/26/2022   Lab Results  Component Value Date   ALT 8 10/08/2012   AST 11 10/08/2012   ALKPHOS 76 10/08/2012   BILITOT 0.2 (L) 10/08/2012   No results found for: "CHOL", "HDL", "LDLCALC", "LDLDIRECT", "TRIG", "CHOLHDL"  Lab Results  Component Value Date   HGBA1C 6.0 (H) 08/26/2022       Assessment &  Plan   1.  GERD: Treated with PPI with significant improvement in symptoms.  Chest pain was noncardiac.  Continue current regimen and follow-up with PCP.  2.  Morbid obesity: Has lost 13 pounds with GLT inhibitor Mounjaro.  Being followed by PCP and Mounjaro study for weight loss.  3.  Hypertension: Excellent control of blood pressure on losartan HCTZ.  No changes in regimen.         Signed, Bettey Mare. Liborio Nixon, ANP, AACC   01/27/2023 4:26 PM      Office [phone] Fax [phone]  Notice: This dictation was prepared with Dragon dictation along with smaller phrase technology. Any transcriptional errors that result from this process are unintentional and may not be corrected upon review.

## 2023-01-27 ENCOUNTER — Ambulatory Visit: Payer: 59 | Admitting: Nurse Practitioner

## 2023-01-27 ENCOUNTER — Encounter: Payer: Self-pay | Admitting: Adult Health

## 2023-01-27 ENCOUNTER — Ambulatory Visit: Payer: 59 | Attending: Nurse Practitioner | Admitting: Adult Health

## 2023-01-27 VITALS — BP 134/80 | HR 89 | Ht 62.0 in | Wt 224.4 lb

## 2023-01-27 DIAGNOSIS — K2100 Gastro-esophageal reflux disease with esophagitis, without bleeding: Secondary | ICD-10-CM

## 2023-01-27 DIAGNOSIS — I10 Essential (primary) hypertension: Secondary | ICD-10-CM | POA: Diagnosis not present

## 2023-01-27 NOTE — Patient Instructions (Signed)
Medication Instructions:  No Changes *If you need a refill on your cardiac medications before your next appointment, please call your pharmacy*   Lab Work: No labs If you have labs (blood work) drawn today and your tests are completely normal, you will receive your results only by: MyChart Message (if you have MyChart) OR A paper copy in the mail If you have any lab test that is abnormal or we need to change your treatment, we will call you to review the results.   Testing/Procedures: No Testing   Follow-Up: At [facility], you and your health needs are our priority.  As part of our continuing mission to provide you with exceptional heart care, we have created designated Provider Care Teams.  These Care Teams include your primary Cardiologist (physician) and Advanced Practice Providers (APPs -  Physician Assistants and Nurse Practitioners) who all work together to provide you with the care you need, when you need it.  We recommend signing up for the patient portal called "MyChart".  Sign up information is provided on this After Visit Summary.  MyChart is used to connect with patients for Virtual Visits (Telemedicine).  Patients are able to view lab/test results, encounter notes, upcoming appointments, etc.  Non-urgent messages can be sent to your provider as well.   To learn more about what you can do with MyChart, go to [email or web address].    Your next appointment:   1 year(s)  Provider:   Jonathan Berry, MD    

## 2023-11-06 ENCOUNTER — Ambulatory Visit: Admitting: Podiatry

## 2023-11-06 DIAGNOSIS — S90229A Contusion of unspecified lesser toe(s) with damage to nail, initial encounter: Secondary | ICD-10-CM | POA: Insufficient documentation

## 2023-11-06 DIAGNOSIS — L821 Other seborrheic keratosis: Secondary | ICD-10-CM | POA: Insufficient documentation

## 2023-11-06 DIAGNOSIS — L574 Cutis laxa senilis: Secondary | ICD-10-CM | POA: Insufficient documentation

## 2023-11-19 ENCOUNTER — Telehealth: Payer: Self-pay

## 2023-11-19 NOTE — Telephone Encounter (Signed)
   Name: Whitney Foster  DOB: 03-25-1961  MRN: [id number]  Primary Cardiologist: Lauro Portal, MD  Chart reviewed as part of pre-operative protocol coverage. Because of Whitney Foster's past medical history and time since last visit, she will require a follow-up in-office visit in order to better assess preoperative cardiovascular risk.  Pre-op covering staff: - Please schedule appointment and call patient to inform them. If patient already had an upcoming appointment within acceptable timeframe, please add "pre-op clearance" to the appointment notes so provider is aware. - Please contact requesting surgeon's office via preferred method (i.e, phone, fax) to inform them of need for appointment prior to surgery.   Francene Ing, Retha Cast, NP  11/19/2023, 9:10 AM

## 2023-11-19 NOTE — Telephone Encounter (Signed)
 Preop clearance appt now scheduled

## 2023-11-19 NOTE — Telephone Encounter (Signed)
   Pre-operative Risk Assessment    Patient Name: Whitney Foster  DOB: 1960/11/26 MRN: [id number]   Date of last office visit: 01/27/23 Date of next office visit: Not scheduled   Request for Surgical Clearance    Procedure:   Rt total hip replacement  Date of Surgery:  Clearance TBD                                Surgeon:  Priscille Brought, M.D. Surgeon's Group or Practice Name:  [facility] Phone number:  [phone] Fax number:  [phone]   Type of Clearance Requested:   - Medical    Type of Anesthesia:  Spinal   Additional requests/questions:    [name]   11/19/2023, 9:03 AM

## 2023-12-08 NOTE — Progress Notes (Unsigned)
 Cardiology Office Note:    Date:  12/09/2023  ID:  Whitney Foster, DOB [birth year], MRN [id number] PCP: Merl Star, MD  [facility] Providers Cardiologist:  Lauro Portal, MD       Patient Profile:      Hypertension  Pre-diabetes  OSA  TTE 06/23/18: mild conc LVH, EF 55-60, no RWMA, trivial TR, mild PI  CAC score 01/08/21: 0        Discussed the use of AI scribe software for clinical note transcription with the patient, who gave verbal consent to proceed.  History of Present Illness JULYANNA Foster is a 63 y.o. female who returns for surgical clearance. She needs R THR with Dr. Pryor Browning under spinal anesthesia. She was last seen in clinic by Friddie Jetty, NP in 01/2023.   She is here alone.  She experiences significant right hip pain that limits her daily activities, necessitating the use of a cane. The pain affects her ability to perform tasks such as climbing stairs, vacuuming, and sweeping. Walking on level ground is possible but painful. Despite the pain, she strives to maintain activity, as demonstrated by her recent attendance at a graduation event at [employer]. She reports no chest pain, shortness of breath, dizziness, or syncope during physical activities. She sometimes feels a strain when using her hands to push herself up from a seated position. She does not smoke and has a coronary calcium score of zero from June 2022, indicating no significant coronary artery disease. She has a family history of congestive heart failure in her mother and brother.   ROS-See HPI    Studies Reviewed:   EKG Interpretation Date/Time:  Tuesday Dec 09 2023 09:00:18 EDT Ventricular Rate:  92 PR Interval:  166 QRS Duration:  80 QT Interval:  346 QTC Calculation: 427 R Axis:   2  Text Interpretation: Normal sinus rhythm No ST-TW changes No significant change since last tracing Confirmed by Marlyse Single [phone]) on 12/09/2023 9:03:34 AM    Results    Risk  Assessment/Calculations:             Physical Exam:   VS:  BP 123/70   Pulse 92   Ht 5\' 1"  (1.549 m)   Wt 195 lb 9.6 oz (88.7 kg)   LMP 04/14/2013   SpO2 99%   BMI 36.96 kg/m    Wt Readings from Last 3 Encounters:  12/09/23 195 lb 9.6 oz (88.7 kg)  01/27/23 224 lb 6.4 oz (101.8 kg)  11/16/22 225 lb (102.1 kg)    Constitutional:      Appearance: Healthy appearance. Not in distress.  Neck:     Vascular: No carotid bruit.  Pulmonary:     Breath sounds: Normal breath sounds. No wheezing. No rales.  Cardiovascular:     Normal rate. Regular rhythm.     Murmurs: There is no murmur.     Comments: DP/PT 2+ bilat Edema:    Peripheral edema absent.        Assessment and Plan:   Assessment & Plan Preoperative cardiovascular examination Ms. Dowen's perioperative risk of a major cardiac event is 0.4% according to the Revised Cardiac Risk Index (RCRI).  Therefore, she is at low risk for perioperative complications.   Her functional capacity is fair at 4.4 METs according to the Duke Activity Status Index (DASI). Recommendations: According to ACC/AHA guidelines, no further cardiovascular testing needed.  The patient may proceed to surgery at acceptable risk.    Essential hypertension  Controlled on current Rx with Losartan/hydrochlorothiazide 50/12.5 mg once daily. Continue current Rx.          Dispo:  Return in about 1 year (around 12/08/2024) for Routine Follow Up w/ Dr. Katheryne Pane, Friddie Jetty, NP, or Marlyse Single, PA-C.  Signed, Marlyse Single, PA-C

## 2023-12-09 ENCOUNTER — Encounter: Payer: Self-pay | Admitting: Physician Assistant

## 2023-12-09 ENCOUNTER — Ambulatory Visit: Attending: Physician Assistant | Admitting: Physician Assistant

## 2023-12-09 VITALS — BP 123/70 | HR 92 | Ht 61.0 in | Wt 195.6 lb

## 2023-12-09 DIAGNOSIS — Z01810 Encounter for preprocedural cardiovascular examination: Secondary | ICD-10-CM

## 2023-12-09 DIAGNOSIS — I10 Essential (primary) hypertension: Secondary | ICD-10-CM | POA: Diagnosis not present

## 2023-12-09 NOTE — Patient Instructions (Signed)
 Medication Instructions:  Your physician recommends that you continue on your current medications as directed. Please refer to the Current Medication list given to you today.  *If you need a refill on your cardiac medications before your next appointment, please call your pharmacy*  Lab Work: None ordered  If you have labs (blood work) drawn today and your tests are completely normal, you will receive your results only by: MyChart Message (if you have MyChart) OR A paper copy in the mail If you have any lab test that is abnormal or we need to change your treatment, we will call you to review the results.  Testing/Procedures: None ordered  Follow-Up: At [facility], you and your health needs are our priority.  As part of our continuing mission to provide you with exceptional heart care, our providers are all part of one team.  This team includes your primary Cardiologist (physician) and Advanced Practice Providers or APPs (Physician Assistants and Nurse Practitioners) who all work together to provide you with the care you need, when you need it.  Your next appointment:   12 month(s)  Provider:   Lauro Portal, MD    We recommend signing up for the patient portal called "MyChart".  Sign up information is provided on this After Visit Summary.  MyChart is used to connect with patients for Virtual Visits (Telemedicine).  Patients are able to view lab/test results, encounter notes, upcoming appointments, etc.  Non-urgent messages can be sent to your provider as well.   To learn more about what you can do with MyChart, go to [email or web address].   Other Instructions

## 2023-12-09 NOTE — Assessment & Plan Note (Signed)
 Controlled on current Rx with Losartan/hydrochlorothiazide 50/12.5 mg once daily. Continue current Rx.

## 2023-12-18 ENCOUNTER — Ambulatory Visit: Admitting: Podiatry

## 2023-12-23 ENCOUNTER — Ambulatory Visit: Admitting: Podiatry

## 2023-12-23 ENCOUNTER — Encounter: Payer: Self-pay | Admitting: Podiatry

## 2023-12-23 DIAGNOSIS — Z780 Asymptomatic menopausal state: Secondary | ICD-10-CM | POA: Insufficient documentation

## 2023-12-23 DIAGNOSIS — N920 Excessive and frequent menstruation with regular cycle: Secondary | ICD-10-CM | POA: Insufficient documentation

## 2023-12-23 DIAGNOSIS — L603 Nail dystrophy: Secondary | ICD-10-CM

## 2023-12-23 DIAGNOSIS — K625 Hemorrhage of anus and rectum: Secondary | ICD-10-CM | POA: Insufficient documentation

## 2023-12-23 DIAGNOSIS — Z87448 Personal history of other diseases of urinary system: Secondary | ICD-10-CM | POA: Insufficient documentation

## 2023-12-23 DIAGNOSIS — M1990 Unspecified osteoarthritis, unspecified site: Secondary | ICD-10-CM

## 2023-12-23 HISTORY — DX: Unspecified osteoarthritis, unspecified site: M19.90

## 2023-12-23 NOTE — Progress Notes (Signed)
 Subjective:  Patient ID: Whitney Foster, female    DOB: 09-01-1960,  MRN: [id number] HPI Chief Complaint  Patient presents with   Nail Problem    Hallux bilateral - thick, dark nails x years, comes and goes, usually just splits but this is the first time its been dark, scheduled for hip surgery   New Patient (Initial Visit)    63 y.o. female presents with the above complaint.   ROS: Denies fever chills nausea muscle aches pains calf pain back pain chest pain shortness of breath.  Past Medical History:  Diagnosis Date   Anemia    secondary to heavy menses- transfused in the past   Back pain    Edema of both lower extremities    Fibroids    "2000"   GERD (gastroesophageal reflux disease)    Hip pain    Hypertension    Joint pain    Obesity    BMI 41   Osteoarthritis 12/23/2023   Pre-diabetes    Sciatic pain    Sleep apnea    Vitamin D  deficiency    Past Surgical History:  Procedure Laterality Date   BREAST EXCISIONAL BIOPSY Left    CARPAL TUNNEL RELEASE N/A    CESAREAN SECTION     "1998"   MYOMECTOMY ABDOMINAL APPROACH     1994   MYOMECTOMY VAGINAL APPROACH     1997   vocal nodules      Current Outpatient Medications:    finasteride (PROSCAR) 5 MG tablet, Take by mouth., Disp: , Rfl:    minoxidil (LONITEN) 2.5 MG tablet, Take by mouth., Disp: , Rfl:    traMADol (ULTRAM) 50 MG tablet, Take 50 mg by mouth daily as needed., Disp: , Rfl:    acetaminophen  (TYLENOL ) 325 MG tablet, Take 2 tablets (650 mg total) by mouth every 6 (six) hours as needed for up to 30 doses for moderate pain or mild pain., Disp: 30 tablet, Rfl: 0   cetirizine (ZYRTEC) 10 MG tablet, Take 10 mg by mouth daily., Disp: , Rfl:    cyclobenzaprine  (FLEXERIL ) 10 MG tablet, Take 1 tablet (10 mg total) by mouth 2 (two) times daily as needed for up to 14 doses for muscle spasms., Disp: 14 tablet, Rfl: 0   ibuprofen  (ADVIL ,MOTRIN ) 200 MG tablet, Take 200 mg by mouth every 6 (six) hours as needed for  pain., Disp: , Rfl:    losartan-hydrochlorothiazide (HYZAAR) 50-12.5 MG per tablet, Take 1 tablet by mouth daily., Disp: , Rfl:    meloxicam (MOBIC) 15 MG tablet, Take 15 mg by mouth as needed for pain., Disp: , Rfl:   Allergies  Allergen Reactions   Hydrocodone-Acetaminophen      Other reaction(s): GI Upset (intolerance)   Review of Systems Objective:  There were no vitals filed for this visit.  General: Well developed, nourished, in no acute distress, alert and oriented x3   Dermatological: Skin is warm, dry and supple bilateral. Nails x 10 are well maintained; remaining integument appears unremarkable at this time. There are no open sores, no preulcerative lesions, no rash or signs of infection present.  Subungual hematoma hallux bilateral is already growing out.  Vascular: Dorsalis Pedis artery and Posterior Tibial artery pedal pulses are 2/4 bilateral with immedate capillary fill time. Pedal hair growth present. No varicosities and no lower extremity edema present bilateral.   Neruologic: Grossly intact via light touch bilateral. Vibratory intact via tuning fork bilateral. Protective threshold with Semmes Wienstein monofilament intact to all pedal sites  bilateral. Patellar and Achilles deep tendon reflexes 2+ bilateral. No Babinski or clonus noted bilateral.   Musculoskeletal: No gross boney pedal deformities bilateral. No pain, crepitus, or limitation noted with foot and ankle range of motion bilateral. Muscular strength 5/5 in all groups tested bilateral.  Gait: Unassisted, Nonantalgic.    Radiographs:  None taken  Assessment & Plan:   Assessment: Subungual hematoma hallux bilateral.  Plan: Trim the hallux nail left.  Recommended she does like these nails growing out.     Aidenjames Heckmann T. [place], [place]

## 2024-01-20 ENCOUNTER — Ambulatory Visit: Admitting: Podiatry

## 2024-04-12 ENCOUNTER — Other Ambulatory Visit: Payer: Self-pay | Admitting: Obstetrics and Gynecology

## 2024-04-12 DIAGNOSIS — Z1231 Encounter for screening mammogram for malignant neoplasm of breast: Secondary | ICD-10-CM

## 2024-04-29 NOTE — Telephone Encounter (Signed)
 S/w the pt and she explains to be that she is having to have some dental work that she needs to have the dental office provide clearance for her hip surgery as well. Pt tells me that she is also having surgery to be done with Dr. Fidel though not scheduled yet and we will need a clearance form sent to our office fax# [phone] attn: preop team .   Once we have the request from Dr. Fidel we can proceed and looks like she needs a tele preop appt as well.   Pt would like to know does Dr. Fidel feel that he needs to have a dental clearance on her before he proceeds with her surgery? I assured the pt that I will send a note to [facility], surgery scheduler for Dr. Fidel to discuss further.   Per pt right now hip surgery is on hold until dental clearance is done.

## 2024-07-07 ENCOUNTER — Ambulatory Visit: Payer: Self-pay | Admitting: Student

## 2024-07-11 ENCOUNTER — Emergency Department (HOSPITAL_COMMUNITY): Admitting: Certified Registered Nurse Anesthetist

## 2024-07-11 ENCOUNTER — Emergency Department (HOSPITAL_COMMUNITY)

## 2024-07-11 ENCOUNTER — Other Ambulatory Visit: Payer: Self-pay

## 2024-07-11 ENCOUNTER — Ambulatory Visit (HOSPITAL_COMMUNITY)
Admission: EM | Admit: 2024-07-11 | Discharge: 2024-07-13 | Disposition: A | Attending: Specialist | Admitting: Specialist

## 2024-07-11 ENCOUNTER — Encounter (HOSPITAL_COMMUNITY): Payer: Self-pay

## 2024-07-11 ENCOUNTER — Encounter (HOSPITAL_COMMUNITY): Admission: EM | Disposition: A | Payer: Self-pay | Source: Home / Self Care | Attending: Emergency Medicine

## 2024-07-11 DIAGNOSIS — T84020A Dislocation of internal right hip prosthesis, initial encounter: Secondary | ICD-10-CM | POA: Diagnosis not present

## 2024-07-11 DIAGNOSIS — S73004A Unspecified dislocation of right hip, initial encounter: Secondary | ICD-10-CM

## 2024-07-11 DIAGNOSIS — G709 Myoneural disorder, unspecified: Secondary | ICD-10-CM | POA: Diagnosis not present

## 2024-07-11 DIAGNOSIS — W19XXXA Unspecified fall, initial encounter: Secondary | ICD-10-CM | POA: Diagnosis not present

## 2024-07-11 DIAGNOSIS — M169 Osteoarthritis of hip, unspecified: Secondary | ICD-10-CM | POA: Diagnosis present

## 2024-07-11 DIAGNOSIS — Y792 Prosthetic and other implants, materials and accessory orthopedic devices associated with adverse incidents: Secondary | ICD-10-CM | POA: Diagnosis not present

## 2024-07-11 DIAGNOSIS — G4730 Sleep apnea, unspecified: Secondary | ICD-10-CM | POA: Diagnosis not present

## 2024-07-11 DIAGNOSIS — Y93E1 Activity, personal bathing and showering: Secondary | ICD-10-CM | POA: Diagnosis not present

## 2024-07-11 DIAGNOSIS — I10 Essential (primary) hypertension: Secondary | ICD-10-CM | POA: Diagnosis not present

## 2024-07-11 DIAGNOSIS — K219 Gastro-esophageal reflux disease without esophagitis: Secondary | ICD-10-CM | POA: Diagnosis not present

## 2024-07-11 DIAGNOSIS — M25559 Pain in unspecified hip: Secondary | ICD-10-CM | POA: Diagnosis present

## 2024-07-11 DIAGNOSIS — Z8249 Family history of ischemic heart disease and other diseases of the circulatory system: Secondary | ICD-10-CM | POA: Diagnosis not present

## 2024-07-11 DIAGNOSIS — M1610 Unilateral primary osteoarthritis, unspecified hip: Secondary | ICD-10-CM | POA: Diagnosis not present

## 2024-07-11 HISTORY — PX: HIP CLOSED REDUCTION: SHX983

## 2024-07-11 LAB — I-STAT CHEM 8, ED
BUN: 11 mg/dL (ref 8–23)
Calcium, Ion: 1.27 mmol/L (ref 1.15–1.40)
Chloride: 104 mmol/L (ref 98–111)
Creatinine, Ser: 0.8 mg/dL (ref 0.44–1.00)
Glucose, Bld: 103 mg/dL — ABNORMAL HIGH (ref 70–99)
HCT: 27 % — ABNORMAL LOW (ref 36.0–46.0)
Hemoglobin: 9.2 g/dL — ABNORMAL LOW (ref 12.0–15.0)
Potassium: 3.7 mmol/L (ref 3.5–5.1)
Sodium: 140 mmol/L (ref 135–145)
TCO2: 23 mmol/L (ref 22–32)

## 2024-07-11 SURGERY — CLOSED REDUCTION, HIP
Anesthesia: General | Site: Hip | Laterality: Right

## 2024-07-11 MED ORDER — ACETAMINOPHEN 500 MG PO TABS: 1000.0000 mg | ORAL_TABLET | Freq: Once | ORAL | Status: DC

## 2024-07-11 MED ORDER — EPHEDRINE SULFATE-NACL 50-0.9 MG/10ML-% IV SOSY: PREFILLED_SYRINGE | INTRAVENOUS | Status: DC | PRN

## 2024-07-11 MED ORDER — FENTANYL CITRATE (PF) 100 MCG/2ML IJ SOLN
INTRAMUSCULAR | Status: AC
  Filled 2024-07-11: qty 2

## 2024-07-11 MED ORDER — LOSARTAN POTASSIUM 50 MG PO TABS
50.0000 mg | ORAL_TABLET | Freq: Every day | ORAL | Status: DC
  Administered 2024-07-12 – 2024-07-13 (×2): 50 mg via ORAL
  Filled 2024-07-11 (×2): qty 1

## 2024-07-11 MED ORDER — LORATADINE 10 MG PO TABS
10.0000 mg | ORAL_TABLET | Freq: Every day | ORAL | Status: DC
  Administered 2024-07-12 – 2024-07-13 (×2): 10 mg via ORAL
  Filled 2024-07-11 (×2): qty 1

## 2024-07-11 MED ORDER — ACETAMINOPHEN 10 MG/ML IV SOLN
1000.0000 mg | Freq: Once | INTRAVENOUS | Status: AC
  Administered 2024-07-11: 1000 mg via INTRAVENOUS

## 2024-07-11 MED ORDER — ONDANSETRON HCL 4 MG/2ML IJ SOLN
INTRAMUSCULAR | Status: DC | PRN
  Administered 2024-07-11: 4 mg via INTRAVENOUS

## 2024-07-11 MED ORDER — LACTATED RINGERS IV SOLN: INTRAVENOUS | Status: DC

## 2024-07-11 MED ORDER — MORPHINE SULFATE (PF) 4 MG/ML IV SOLN
4.0000 mg | Freq: Once | INTRAVENOUS | Status: DC | PRN
  Filled 2024-07-11: qty 1

## 2024-07-11 MED ORDER — MINOXIDIL 2.5 MG PO TABS: 2.5000 mg | ORAL_TABLET | Freq: Every day | ORAL | Status: DC

## 2024-07-11 MED ORDER — FENTANYL CITRATE (PF) 50 MCG/ML IJ SOSY
25.0000 ug | PREFILLED_SYRINGE | INTRAMUSCULAR | Status: DC | PRN
  Administered 2024-07-11: 50 ug via INTRAVENOUS

## 2024-07-11 MED ORDER — DEXAMETHASONE SODIUM PHOSPHATE 4 MG/ML IJ SOLN
INTRAMUSCULAR | Status: DC | PRN
  Administered 2024-07-11: 10 mg via INTRAVENOUS

## 2024-07-11 MED ORDER — METHOCARBAMOL 1000 MG/10ML IJ SOLN
INTRAMUSCULAR | Status: AC
  Filled 2024-07-11: qty 10

## 2024-07-11 MED ORDER — SODIUM CHLORIDE 0.9 % IV SOLN: INTRAVENOUS | Status: DC | PRN

## 2024-07-11 MED ORDER — ONDANSETRON HCL 4 MG/2ML IJ SOLN
4.0000 mg | Freq: Once | INTRAMUSCULAR | Status: DC | PRN
  Filled 2024-07-11: qty 2

## 2024-07-11 MED ORDER — POVIDONE-IODINE 10 % EX SWAB: 2.0000 | Freq: Once | CUTANEOUS | Status: DC

## 2024-07-11 MED ORDER — PROPOFOL 10 MG/ML IV BOLUS
INTRAVENOUS | Status: DC | PRN
  Administered 2024-07-11 (×7): 50 mg via INTRAVENOUS

## 2024-07-11 MED ORDER — LIDOCAINE 2% (20 MG/ML) 5 ML SYRINGE
INTRAMUSCULAR | Status: DC | PRN
  Administered 2024-07-11: 50 mg via INTRAVENOUS

## 2024-07-11 MED ORDER — ASPIRIN 81 MG PO CHEW
81.0000 mg | CHEWABLE_TABLET | Freq: Two times a day (BID) | ORAL | Status: DC
  Administered 2024-07-12 – 2024-07-13 (×3): 81 mg via ORAL
  Filled 2024-07-11 (×3): qty 1

## 2024-07-11 MED ORDER — OXYCODONE HCL 5 MG PO TABS: 10.0000 mg | ORAL_TABLET | ORAL | Status: DC | PRN

## 2024-07-11 MED ORDER — ONDANSETRON HCL 4 MG/2ML IJ SOLN: 4.0000 mg | Freq: Four times a day (QID) | INTRAMUSCULAR | Status: DC | PRN

## 2024-07-11 MED ORDER — FENTANYL CITRATE (PF) 50 MCG/ML IJ SOSY
25.0000 ug | PREFILLED_SYRINGE | INTRAMUSCULAR | Status: DC | PRN
  Administered 2024-07-11 (×2): 50 ug via INTRAVENOUS

## 2024-07-11 MED ORDER — FENTANYL CITRATE (PF) 50 MCG/ML IJ SOSY
PREFILLED_SYRINGE | INTRAMUSCULAR | Status: AC
  Filled 2024-07-11: qty 2

## 2024-07-11 MED ORDER — OXYCODONE HCL 5 MG/5ML PO SOLN: 5.0000 mg | Freq: Once | ORAL | Status: DC | PRN

## 2024-07-11 MED ORDER — POTASSIUM CHLORIDE IN NACL 20-0.45 MEQ/L-% IV SOLN
INTRAVENOUS | Status: AC
  Filled 2024-07-11: qty 1000

## 2024-07-11 MED ORDER — FENTANYL CITRATE (PF) 100 MCG/2ML IJ SOLN
INTRAMUSCULAR | Status: DC | PRN
  Administered 2024-07-11 (×2): 50 ug via INTRAVENOUS

## 2024-07-11 MED ORDER — METHOCARBAMOL 1000 MG/10ML IJ SOLN: 500.0000 mg | Freq: Once | INTRAMUSCULAR | Status: DC

## 2024-07-11 MED ORDER — ACETAMINOPHEN 10 MG/ML IV SOLN
INTRAVENOUS | Status: AC
  Filled 2024-07-11: qty 100

## 2024-07-11 MED ORDER — DOCUSATE SODIUM 100 MG PO CAPS
100.0000 mg | ORAL_CAPSULE | Freq: Two times a day (BID) | ORAL | Status: DC
  Administered 2024-07-11 – 2024-07-13 (×4): 100 mg via ORAL
  Filled 2024-07-11 (×4): qty 1

## 2024-07-11 MED ORDER — HYDROCHLOROTHIAZIDE 12.5 MG PO TABS
12.5000 mg | ORAL_TABLET | Freq: Every day | ORAL | Status: DC
  Administered 2024-07-12 – 2024-07-13 (×2): 12.5 mg via ORAL
  Filled 2024-07-11 (×2): qty 1

## 2024-07-11 MED ORDER — CYCLOBENZAPRINE HCL 10 MG PO TABS
10.0000 mg | ORAL_TABLET | Freq: Two times a day (BID) | ORAL | Status: DC | PRN
  Administered 2024-07-12: 10 mg via ORAL
  Filled 2024-07-11: qty 1

## 2024-07-11 MED ORDER — OXYCODONE HCL 5 MG PO TABS: 5.0000 mg | ORAL_TABLET | Freq: Once | ORAL | Status: DC | PRN

## 2024-07-11 MED ORDER — FENTANYL CITRATE (PF) 50 MCG/ML IJ SOSY
PREFILLED_SYRINGE | INTRAMUSCULAR | Status: AC
  Filled 2024-07-11: qty 1

## 2024-07-11 MED ORDER — PROPOFOL 10 MG/ML IV BOLUS
0.5000 mg/kg | Freq: Once | INTRAVENOUS | Status: AC
  Administered 2024-07-11: 150 mg via INTRAVENOUS
  Filled 2024-07-11: qty 20

## 2024-07-11 MED ORDER — PROPOFOL 500 MG/50ML IV EMUL
INTRAVENOUS | Status: AC
  Filled 2024-07-11: qty 50

## 2024-07-11 MED ORDER — TRAMADOL HCL 50 MG PO TABS
50.0000 mg | ORAL_TABLET | Freq: Four times a day (QID) | ORAL | Status: DC | PRN
  Administered 2024-07-12 – 2024-07-13 (×5): 50 mg via ORAL
  Filled 2024-07-11 (×5): qty 1

## 2024-07-11 MED ORDER — CEFAZOLIN SODIUM-DEXTROSE 2-4 GM/100ML-% IV SOLN: 2.0000 g | INTRAVENOUS | Status: DC

## 2024-07-11 MED ORDER — TRANEXAMIC ACID-NACL 1000-0.7 MG/100ML-% IV SOLN: 1000.0000 mg | INTRAVENOUS | Status: DC

## 2024-07-11 MED ORDER — FINASTERIDE 5 MG PO TABS: 5.0000 mg | ORAL_TABLET | Freq: Every day | ORAL | Status: DC

## 2024-07-11 MED ORDER — HYDROMORPHONE HCL 1 MG/ML IJ SOLN: 0.5000 mg | INTRAMUSCULAR | Status: DC | PRN

## 2024-07-11 MED ORDER — ONDANSETRON HCL 4 MG/2ML IJ SOLN
INTRAMUSCULAR | Status: AC
  Filled 2024-07-11: qty 2

## 2024-07-11 MED ORDER — METOCLOPRAMIDE HCL 5 MG/ML IJ SOLN: 5.0000 mg | Freq: Three times a day (TID) | INTRAMUSCULAR | Status: DC | PRN

## 2024-07-11 MED ORDER — METOCLOPRAMIDE HCL 5 MG PO TABS: 5.0000 mg | ORAL_TABLET | Freq: Three times a day (TID) | ORAL | Status: DC | PRN

## 2024-07-11 MED ORDER — LOSARTAN POTASSIUM-HCTZ 50-12.5 MG PO TABS: 1.0000 | ORAL_TABLET | Freq: Every day | ORAL | Status: DC

## 2024-07-11 MED ORDER — LIDOCAINE HCL (PF) 2 % IJ SOLN
INTRAMUSCULAR | Status: AC
  Filled 2024-07-11: qty 5

## 2024-07-11 NOTE — H&P (Addendum)
 Whitney Foster is an 63 y.o. female.   Chief Complaint: right hip pain HPI: felt a pop after twisting. Hip dislocation with unsuccessful reduction in ER  Past Medical History:  Diagnosis Date   Anemia    secondary to heavy menses- transfused in the past   Back pain    Edema of both lower extremities    Fibroids    2000   GERD (gastroesophageal reflux disease)    Hip pain    Hypertension    Joint pain    Obesity    BMI 41   Osteoarthritis 12/23/2023   Pre-diabetes    Sciatic pain    Sleep apnea    Vitamin D  deficiency     Past Surgical History:  Procedure Laterality Date   BREAST EXCISIONAL BIOPSY Left    CARPAL TUNNEL RELEASE N/A    CESAREAN SECTION     1998   MYOMECTOMY ABDOMINAL APPROACH     1994   MYOMECTOMY VAGINAL APPROACH     1997   vocal nodules      Family History  Problem Relation Age of Onset   Heart disease Mother 73   Breast cancer Mother    Diabetes Mother    Stroke Mother    High blood pressure Mother    Heart disease Brother    Heart disease Maternal Aunt    Social History:  reports that she has never smoked. She has never used smokeless tobacco. She reports that she does not drink alcohol and does not use drugs.  Allergies:  Allergies  Allergen Reactions   Hydrocodone-Acetaminophen      Other reaction(s): GI Upset (intolerance)    (Not in a hospital admission)   Results for orders placed or performed during the hospital encounter of 07/11/24 (from the past 48 hours)  I-stat chem 8, ED (not at [facility], [facility] or [facility])     Status: Abnormal   Collection Time: 07/11/24  3:15 PM  Result Value Ref Range   Sodium 140 135 - 145 mmol/L   Potassium 3.7 3.5 - 5.1 mmol/L   Chloride 104 98 - 111 mmol/L   BUN 11 8 - 23 mg/dL   Creatinine, Ser 9.19 0.44 - 1.00 mg/dL   Glucose, Bld 896 (H) 70 - 99 mg/dL    Comment: Glucose reference range applies only to samples taken after fasting for at least 8 hours.   Calcium, Ion 1.27 1.15 - 1.40 mmol/L    TCO2 23 22 - 32 mmol/L   Hemoglobin 9.2 (L) 12.0 - 15.0 g/dL   HCT 72.9 (L) 63.9 - 53.9 %   DG Hip Unilat W or Wo Pelvis 2-3 Views Right Result Date: 07/11/2024 CLINICAL DATA:  Dislocation right hip arthroplasty EXAM: DG HIP (WITH OR WITHOUT PELVIS) 2-3V RIGHT COMPARISON:  11/16/2022 FINDINGS: Frontal view of the pelvis as well as frontal and cross-table lateral views of the right hip are obtained. There is superior and anterior dislocation of the femoral component of the right hip arthroplasty. There are no acute displaced fractures. Soft tissues are unremarkable. IMPRESSION: 1. Superior and anterior dislocation of the right hip arthroplasty. Electronically Signed   By: Ozell Daring M.D.   On: 07/11/2024 15:07    Review of Systems  Musculoskeletal:  Positive for arthralgias.  All other systems reviewed and are negative.  Exam of the right leg is actually longer than the left.  Approximately 4 cm.  She is neurovascularly intact has pain with attempted range of motion of the  hip.  Blood pressure (!) 143/110, pulse 87, temperature 98 F (36.7 C), temperature source Oral, resp. rate 16, height 5' 1 (1.549 m), weight 90.7 kg, last menstrual period 04/14/2013, SpO2 100%.    Assessment/Plan  Dislocated right total hip arthroplasty  Plan:  Closed reduction under anesthesia.  Discussed with Husband and patient Risks and benefits discussed including inability to reduce and possibly required open close reduction  Reyes JAYSON Billing, MD 07/11/2024, 5:13 PM

## 2024-07-11 NOTE — ED Triage Notes (Addendum)
 Pt BIB EMS from hor for a possible re-disclocation of her right hip. Pt was in the shower and she heard a pop when turning. Pt had right hip surgery on October 31st. Pt was given 200mcg of fentanyl  with EMS.

## 2024-07-11 NOTE — Brief Op Note (Addendum)
 07/11/2024  5:56 PM  PATIENT:  Whitney Foster  63 y.o. female  PRE-OPERATIVE DIAGNOSIS:  RIGHT HIP DISLOCATION  POST-OPERATIVE DIAGNOSIS:  RIGHT HIP DISLOCATION  PROCEDURE:  Procedure(s): CLOSED REDUCTION, HIP (Right)  SURGEON:  Surgeons and Role:    DEWAINE Duwayne Purchase, MD - Primary  PHYSICIAN ASSISTANT:   ASSISTANTS: Bissell   ANESTHESIA:   general  EBL:  none   BLOOD ADMINISTERED:none  DRAINS: none   LOCAL MEDICATIONS USED:  MARCAINE     SPECIMEN:  No Specimen  DISPOSITION OF SPECIMEN:  N/A  COUNTS:  YES  TOURNIQUET:  * No tourniquets in log *  DICTATION: .Other Dictation: Dictation Number [id number]  PLAN OF CARE: Admit for overnight observation  PATIENT DISPOSITION:  PACU - hemodynamically stable.   Delay start of Pharmacological VTE agent (>24hrs) due to surgical blood loss or risk of bleeding: no  Xray in am to check stability of reduction

## 2024-07-11 NOTE — Op Note (Unsigned)
 NAME: Whitney Foster, Whitney Foster MEDICAL RECORD NO: [id number] ACCOUNT NO: [id number] DATE OF BIRTH: [birth year] FACILITY: [facility]ATION: [facility] PHYSICIAN: Reyes KYM Billing, MD  Operative Report   DATE OF PROCEDURE: 07/11/2024  PREOPERATIVE DIAGNOSIS:  Dislocated right total hip replacement.  POSTOPERATIVE DIAGNOSIS:  Dislocated right total hip replacement.  PROCEDURE PERFORMED:  Closed reduction under anesthesia right hip.  ANESTHESIA:  General.  ASSISTANT:  Arlyne Randy, PA.  HISTORY:  This is a 63 year old status post anterior total hip replacement 6 weeks ago.  Was in a shower, felt a twist, pop, pain, presented to the Emergency Room where she was found to have an anterior dislocation of the hip where she underwent  unsuccessful closed reduction in the Emergency Room.  She was indicated for closed reduction under general anesthesia.  Risk and benefits discussed including bleeding, infection, inability to reduce, need to perform an open revision etc.  DESCRIPTION OF PROCEDURE: With the patient in supine position after induction of adequate general anesthesia, we reviewed her with fluoroscopy.  The hip was found to be anteriorly and superiorly dislocated.  With the pelvis stabilized and gentle traction  applied to the lower extremity and slight flexion, we internally externally rotated the hip, found that with external rotation that increased her dislocation, with internal rotation that decreased the spatial orientation.  Then in the lateral position,  it was again anterior.  With multiple configurations of the extremity in flexion, extension, internal/external rotation with traction, the pelvis stabilized.  We were able to appreciate a reduction of the hip.  Initially, however, we had felt that the  patient might have had interposed soft tissue given that there was a persistent dislocation noted.  Following that there was little change in her leg length as she was longer preoperatively on  the right than the left by approximately 4 cm.  After the reduction in both the AP and the lateral planes, I internally externally rotated her under fluoroscopy  and we had a concentric reduction of the hip.  With gentle external and internal rotation, the hip remained concentrically reduced.  We, therefore, felt that she was relocated.  I only flexed her to 30 degrees and externally rotated her 30 degrees as she  was initially very unstable with external rotation.  We placed her in a knee immobilizer.  Checked the pulses following the reduction.  The patient was then awoken without difficulty and transported to the recovery room in satisfactory condition.  The patient tolerated the procedure well, no complications.    The assistant, Arlyne Randy, PA, was used for additional stabilization and reduction technique.  Patient will be admitted overnight for observation and for followup to determine stability of her hip.       PAA D: 07/11/2024 6:49:42 pm T: 07/11/2024 9:21:00 pm  JOB: [id number]/ [id number]

## 2024-07-11 NOTE — Plan of Care (Signed)
Discuss and review plan of care with patient/family  

## 2024-07-11 NOTE — Brief Op Note (Incomplete)
 07/11/2024  7:31 PM  PATIENT:  Whitney Foster  63 y.o. female  PRE-OPERATIVE DIAGNOSIS:  RIGHT HIP DISLOCATION  POST-OPERATIVE DIAGNOSIS:  RIGHT HIP DISLOCATION  PROCEDURE:  Procedure(s): CLOSED REDUCTION, HIP (Right)  SURGEON:  Surgeons and Role:    DEWAINE Duwayne Purchase, MD - Primary  PHYSICIAN ASSISTANT:   ASSISTANTS: {ASSISTANTS:31801}   ANESTHESIA:   {procedures; anesthesia:812}  EBL:  0 mL   BLOOD ADMINISTERED:{BLOOD GIVEN TYPES AND AMOUNTS:20467}  DRAINS: {Devices; drains:31758}   LOCAL MEDICATIONS USED:  {LOCAL MEDICATIONS:10721995}  SPECIMEN:  {ONC STAGING; AJCC TYPE OF SPECIMEN:115600001}  DISPOSITION OF SPECIMEN:  {SPECIMEN DISPOSITION:204680}  COUNTS:  {OR COUNTS CORRECT/INCORRECT:204690}  TOURNIQUET:  * No tourniquets in log *  DICTATION: .{DICTATION [phone]}  PLAN OF CARE: {OPTIME PLAN OF RJMZ:8924449987}  PATIENT DISPOSITION:  {op note disposition:31782}   Delay start of Pharmacological VTE agent (>24hrs) due to surgical blood loss or risk of bleeding: {YES/NO/NOT APPLICABLE:20182}

## 2024-07-11 NOTE — Anesthesia Procedure Notes (Signed)
 Procedure Name: LMA Insertion Date/Time: 07/11/2024 6:16 PM  Performed by: Judythe Tanda Aran, CRNAPre-anesthesia Checklist: Emergency Drugs available, Patient identified, Suction available and Patient being monitored Patient Re-evaluated:Patient Re-evaluated prior to induction Oxygen Delivery Method: Circle system utilized Preoxygenation: Pre-oxygenation with 100% oxygen Induction Type: IV induction Ventilation: Mask ventilation without difficulty LMA: LMA inserted LMA Size: 4.0 Number of attempts: 1 Placement Confirmation: positive ETCO2 and breath sounds checked- equal and bilateral Tube secured with: Tape Dental Injury: Teeth and Oropharynx as per pre-operative assessment

## 2024-07-11 NOTE — Progress Notes (Signed)
 Respiratory Therapist at [facility]  in room number __22__ during procedure.  Suction with [place] at [facility] set up and ready to use. Ambu bag at [facility] and ready to use.  Patient placed on ETCO2 Nasal Cannula at __2__ LPM.    Vitals at conclusion of procedure:  ETCO2 ___30_ mmHg HR 87 RR 18 SPO2 100

## 2024-07-11 NOTE — Anesthesia Preprocedure Evaluation (Signed)
 Anesthesia Evaluation  Patient identified by MRN, date of birth, ID band Patient awake    Reviewed: Allergy & Precautions, H&P , NPO status , Patient's Chart, lab work & pertinent test results  Airway Mallampati: II   Neck ROM: full    Dental   Pulmonary sleep apnea    breath sounds clear to auscultation       Cardiovascular hypertension,  Rhythm:regular Rate:Normal     Neuro/Psych  Neuromuscular disease    GI/Hepatic ,GERD  ,,  Endo/Other    Renal/GU      Musculoskeletal  (+) Arthritis ,    Abdominal   Peds  Hematology  (+) Blood dyscrasia, anemia   Anesthesia Other Findings   Reproductive/Obstetrics                              Anesthesia Physical Anesthesia Plan  ASA: 2  Anesthesia Plan: General   Post-op Pain Management:    Induction: Intravenous  PONV Risk Score and Plan: 3 and Propofol  infusion and Treatment may vary due to age or medical condition  Airway Management Planned: Mask  Additional Equipment:   Intra-op Plan:   Post-operative Plan:   Informed Consent: I have reviewed the patients History and Physical, chart, labs and discussed the procedure including the risks, benefits and alternatives for the proposed anesthesia with the patient or authorized representative who has indicated his/her understanding and acceptance.     Dental advisory given  Plan Discussed with: CRNA, Anesthesiologist and Surgeon  Anesthesia Plan Comments:         Anesthesia Quick Evaluation

## 2024-07-11 NOTE — ED Provider Notes (Signed)
 [place] EMERGENCY DEPARTMENT AT [facility] Provider Note   CSN: [id number] Arrival date & time: 07/11/24  1353     Patient presents with: Hip Pain   Whitney Foster is a 63 y.o. female.    Hip Pain     Patient has a history of hypertension and anemia acid reflux back pain osteoarthritis.  Patient presents ED for evaluation of hip pain.  Patient states she has had prior hip replacemen she denies any other injuries t surgery.  She was in the shower when she turned and felt a pop and her leg gave way.  Patient is not having pain in her right hip.  Prior to Admission medications   Medication Sig Start Date End Date Taking? Authorizing Provider  acetaminophen  (TYLENOL ) 325 MG tablet Take 2 tablets (650 mg total) by mouth every 6 (six) hours as needed for up to 30 doses for moderate pain or mild pain. 11/16/22   Cottie Donnice PARAS, MD  cetirizine (ZYRTEC) 10 MG tablet Take 10 mg by mouth daily.    [provider]  cyclobenzaprine  (FLEXERIL ) 10 MG tablet Take 1 tablet (10 mg total) by mouth 2 (two) times daily as needed for up to 14 doses for muscle spasms. 11/16/22   Cottie Donnice PARAS, MD  finasteride  (PROSCAR ) 5 MG tablet Take by mouth. 12/13/23   [provider]  ibuprofen  (ADVIL ,MOTRIN ) 200 MG tablet Take 200 mg by mouth every 6 (six) hours as needed for pain.    [provider]  losartan -hydrochlorothiazide  (HYZAAR) 50-12.5 MG per tablet Take 1 tablet by mouth daily.    [provider]  meloxicam (MOBIC) 15 MG tablet Take 15 mg by mouth as needed for pain.    [provider]  minoxidil  (LONITEN ) 2.5 MG tablet Take by mouth. 12/15/23   [provider]  traMADol  (ULTRAM ) 50 MG tablet Take 50 mg by mouth daily as needed. 12/09/23   [provider]    Allergies: Hydrocodone-acetaminophen     Review of Systems  Updated Vital Signs BP (!) 143/110 (BP Location: Left Arm)   Pulse 87   Temp 98 F (36.7 C) (Oral)    Resp 16   Ht 1.549 m (5' 1)   Wt 90.7 kg   LMP 04/14/2013   SpO2 100%   BMI 37.79 kg/m   Physical Exam Vitals and nursing note reviewed.  Constitutional:      General: She is not in acute distress.    Appearance: She is well-developed.  HENT:     Head: Normocephalic and atraumatic.     Right Ear: External ear normal.     Left Ear: External ear normal.  Eyes:     General: No scleral icterus.       Right eye: No discharge.        Left eye: No discharge.     Conjunctiva/sclera: Conjunctivae normal.  Neck:     Trachea: No tracheal deviation.  Cardiovascular:     Rate and Rhythm: Normal rate.  Pulmonary:     Effort: Pulmonary effort is normal. No respiratory distress.     Breath sounds: No stridor.  Abdominal:     General: There is no distension.  Musculoskeletal:        General: Tenderness present. No swelling or deformity.     Cervical back: Neck supple.     Right hip: Tenderness present. Decreased range of motion.     Comments: Normal pulses distally, extremities warm and well-perfused, no  shortening noted  Skin:    General: Skin is warm and dry.     Findings: No rash.  Neurological:     Mental Status: She is alert. Mental status is at baseline.     Cranial Nerves: No dysarthria or facial asymmetry.     Motor: No seizure activity.     (all labs ordered are listed, but only abnormal results are displayed) Labs Reviewed  I-STAT CHEM 8, ED - Abnormal; Notable for the following components:      Result Value   Glucose, Bld 103 (*)    Hemoglobin 9.2 (*)    HCT 27.0 (*)    All other components within normal limits    EKG: None  Radiology: [facility] Hip [place] W or [place] Pelvis 1 View Right Result Date: 07/11/2024 EXAM: 1 VIEW XRAY OF THE RIGHT HIP 07/11/2024 05:14:00 PM COMPARISON: Earlier today. CLINICAL HISTORY: post reduction FINDINGS: BONES AND JOINTS: Continued dislocation of the right hip replacement. No acute fracture. SOFT TISSUES: The soft tissues are  unremarkable. IMPRESSION: 1. Continued dislocation of the right hip replacement. Electronically signed by: Franky Crease MD 07/11/2024 05:20 PM EST RP Workstation: [id number]   DG Hip Unilat W or Wo Pelvis 2-3 Views Right Result Date: 07/11/2024 CLINICAL DATA:  Dislocation right hip arthroplasty EXAM: DG HIP (WITH OR WITHOUT PELVIS) 2-3V RIGHT COMPARISON:  11/16/2022 FINDINGS: Frontal view of the pelvis as well as frontal and cross-table lateral views of the right hip are obtained. There is superior and anterior dislocation of the femoral component of the right hip arthroplasty. There are no acute displaced fractures. Soft tissues are unremarkable. IMPRESSION: 1. Superior and anterior dislocation of the right hip arthroplasty. Electronically Signed   By: Ozell Daring M.D.   On: 07/11/2024 15:07     .Sedation  Date/Time: 07/11/2024 3:39 PM  Performed by: Randol Simmonds, MD Authorized by: Randol Simmonds, MD   Consent:    Consent obtained:  Verbal   Consent given by:  Patient   Risks discussed:  Allergic reaction, dysrhythmia, inadequate sedation, nausea, prolonged hypoxia resulting in organ damage, prolonged sedation necessitating reversal, respiratory compromise necessitating ventilatory assistance and intubation and vomiting   Alternatives discussed:  Analgesia without sedation, anxiolysis and regional anesthesia Universal protocol:    Procedure explained and questions answered to patient or proxy's satisfaction: yes     Relevant documents present and verified: yes     Test results available: yes     Imaging studies available: yes     Required blood products, implants, devices, and special equipment available: yes     Site/side marked: yes     Immediately prior to procedure, a time out was called: yes     Patient identity confirmed:  Verbally with patient Indications:    Procedure necessitating sedation performed by:  Physician performing sedation Pre-sedation assessment:    Time since last food  or drink:  12   ASA classification: class 1 - normal, healthy patient     Mouth opening:  3 or more finger widths   Thyromental distance:  4 finger widths   Mallampati score:  I - soft palate, uvula, fauces, pillars visible   Neck mobility: normal     Pre-sedation assessments completed and reviewed: airway patency, cardiovascular function, hydration status, mental status, nausea/vomiting, pain level, respiratory function and temperature   A pre-sedation assessment was completed prior to the start of the procedure Immediate pre-procedure details:    Reassessment: Patient reassessed immediately prior to procedure  Reviewed: vital signs, relevant labs/tests and NPO status     Verified: bag valve mask available, emergency equipment available, intubation equipment available, IV patency confirmed, oxygen available and suction available   Procedure details (see MAR for exact dosages):    Preoxygenation:  Nasal cannula   Sedation:  Propofol    Intended level of sedation: deep   Intra-procedure monitoring:  Blood pressure monitoring, cardiac monitor, continuous pulse oximetry, frequent LOC assessments, frequent vital sign checks and continuous capnometry   Intra-procedure events: none     Total Provider sedation time (minutes):  15 Post-procedure details:   A post-sedation assessment was completed following the completion of the procedure.   Attendance: Constant attendance by certified staff until patient recovered     Recovery: Patient returned to pre-procedure baseline     Post-sedation assessments completed and reviewed: airway patency, cardiovascular function, hydration status, mental status, nausea/vomiting, pain level, respiratory function and temperature     Patient is stable for discharge or admission: yes     Procedure completion:  Tolerated well, no immediate complications .Reduction of dislocation  Date/Time: 07/11/2024 5:23 PM  Performed by: Randol Simmonds, MD Authorized by: Randol Simmonds, MD  Consent: Verbal consent obtained Risks and benefits: risks, benefits and alternatives were discussed Patient identity confirmed: verbally with patient Time out: Immediately prior to procedure a time out was called to verify the correct patient, procedure, equipment, support staff and site/side marked as required. Local anesthesia used: no  Anesthesia: Local anesthesia used: no  Sedation: Patient sedated: yes  Patient tolerance: patient tolerated the procedure well with no immediate complications Comments: Attempted reduction with traction, rotation.  Unsuccessful reduction      Medications Ordered in the ED  morphine  (PF) 4 MG/ML injection 4 mg (has no administration in time range)  ondansetron  (ZOFRAN ) injection 4 mg (has no administration in time range)  propofol  (DIPRIVAN ) 10 mg/mL bolus/IV push 45.4 mg (has no administration in time range)    Clinical Course as of 07/11/24 1726  Sun Jul 11, 2024  1522 X-ray shows dislocation of right hip arthroplasty [JK]  1527 Reviewed findings with patient.  Patient states she spoke with orthopedics.  She thought they were going to come in to evaluate her.  I will contact Ortho on-call [JK]  1538 I-stat chem 8, ED (not at [facility], [facility] or [facility])(!) Anemia noted similar to previous [JK]  1601 Case discussed with Dr. Baird.  I will go ahead and attempt reduction in the ED [JK]    Clinical Course User Index [JK] Randol Simmonds, MD                                 Medical Decision Making Problems Addressed: Hip dislocation, right, initial encounter [facility]): acute illness or injury that poses a threat to life or bodily functions  Amount and/or Complexity of Data Reviewed Labs:  Decision-making details documented in ED Course. Radiology: ordered and independent interpretation performed.  Risk Prescription drug management. Parenteral controlled substances. Drug therapy requiring intensive monitoring for toxicity. Decision regarding  hospitalization.   Presented to the ED for evaluation of a dislocation of her hip.  Patient had surgery couple months ago.  Patient distal neurovascularly intact.  Did proceed with procedural sedation and attempted reduction.  Unfortunately not successful in reducing her hip.  Discussed the case with Dr. Baird.  He will take the patient to the OR for reduction of her dislocation  Final diagnoses:  Hip dislocation, right, initial encounter [facility])    ED Discharge Orders     None          Randol Simmonds, MD 07/11/24 1726

## 2024-07-11 NOTE — Transfer of Care (Signed)
 Immediate Anesthesia Transfer of Care Note  Patient: Whitney Foster  Procedure(s) Performed: CLOSED REDUCTION, HIP (Right: Hip)  Patient Location: PACU  Anesthesia Type:General  Level of Consciousness: awake  Airway & Oxygen Therapy: Patient Spontanous Breathing and Patient connected to nasal cannula oxygen  Post-op Assessment: Report given to RN and Post -op Vital signs reviewed and stable  Post vital signs: Reviewed and stable  Last Vitals:  Vitals Value Taken Time  BP    Temp    Pulse    Resp    SpO2      Last Pain:  Vitals:   07/11/24 1405  TempSrc:   PainSc: 0-No pain         Complications: No notable events documented.

## 2024-07-12 ENCOUNTER — Encounter (HOSPITAL_COMMUNITY): Payer: Self-pay | Admitting: Specialist

## 2024-07-12 ENCOUNTER — Ambulatory Visit (HOSPITAL_COMMUNITY)

## 2024-07-12 MED ORDER — ACETAMINOPHEN 325 MG PO TABS: 650.0000 mg | ORAL_TABLET | Freq: Three times a day (TID) | ORAL | 0 refills | Status: AC | PRN

## 2024-07-12 MED ORDER — ASPIRIN 81 MG PO CHEW: 81.0000 mg | CHEWABLE_TABLET | Freq: Two times a day (BID) | ORAL | 0 refills | Status: AC

## 2024-07-12 MED ORDER — OXYCODONE HCL 5 MG PO TABS: 5.0000 mg | ORAL_TABLET | ORAL | Status: DC | PRN

## 2024-07-12 MED ORDER — TRAMADOL HCL 50 MG PO TABS: 50.0000 mg | ORAL_TABLET | Freq: Four times a day (QID) | ORAL | 0 refills | Status: AC | PRN

## 2024-07-12 NOTE — TOC Initial Note (Addendum)
 Transition of Care [facility]) - Initial/Assessment Note    Patient Details  Name: Whitney Foster MRN: [id number] Date of Birth: Mar 11, 1961  Transition of Care [facility]) CM/SW Contact:    Alfonse JONELLE Rex, RN Phone Number: 07/12/2024, 10:09 AM  Clinical Narrative:    Admitted from home , presented to ED with c/o hip pain, dx hip dislocation, underwent Closed reduction right hip on 07/11/2024. Patient resides in a private residence, has a PCP and insurance on file, family contacts on file. PT eval pending, await recommendation.     -4:50pm PT eval completed, recommendation for [facility] PT, request sent to Ortho PA to enter [facility] PT order, inquiring if patient could do HEP, PT added to to the chat. [facility], rep-[place], accepted for [facility]  PT ( added to AVS just in case)                   Patient Goals and CMS Choice            Expected Discharge Plan and Services                                              Prior Living Arrangements/Services                       Activities of Daily Living   ADL Screening (condition at time of admission) Independently performs ADLs?: No Does the patient have a NEW difficulty with bathing/dressing/toileting/self-feeding that is expected to last >3 days?: Yes (Initiates electronic notice to provider for possible OT consult) Does the patient have a NEW difficulty with getting in/out of bed, walking, or climbing stairs that is expected to last >3 days?: Yes (Initiates electronic notice to provider for possible PT consult) Does the patient have a NEW difficulty with communication that is expected to last >3 days?: No Is the patient deaf or have difficulty hearing?: No Does the patient have difficulty seeing, even when wearing glasses/contacts?: No Does the patient have difficulty concentrating, remembering, or making decisions?: No  Permission Sought/Granted                  Emotional Assessment              Admission diagnosis:   Hip dislocation, right, initial encounter (HCC) [S73.004A] 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] Patient Active Problem List   Diagnosis Date Noted   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/2024   History of hematuria 12/23/2023   Menopause present 12/23/2023   Menorrhagia 12/23/2023   Osteoarthritis 12/23/2023   Painless rectal bleeding 12/23/2023   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   Skin laxity 11/06/2023   Subungual hematoma of toenail 11/06/2023   Prediabetes 08/12/2022   B12 deficiency 08/12/2022   Vitamin D  deficiency 07/11/2022   Class 3 severe obesity with serious comorbidity and body mass index (BMI) of 40.0 to 44.9 in adult [facility]) 07/11/2022   Eating disorder 07/11/2022   Atypical chest pain 12/05/2020   Mass of left breast 08/03/2019   Microscopic hematuria 07/15/2019   Loss of hair 12/10/2018   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   Seborrheic dermatitis of scalp 10/22/2018   Fatigue 01/02/2017   Bradycardia 09/26/2016   Sleep apnea 09/26/2016   Abnormal weight gain 02/27/2016   Obesity (BMI 30-39.9) 02/27/2016   Fluid retention 02/01/2016   Lower extremity  edema 06/25/2013   DOE (dyspnea on exertion) 06/25/2013   Morbid obesity (HCC) 06/25/2013   Anemia 10/08/2012   Essential hypertension 10/08/2012   Elevated blood-pressure reading without diagnosis of hypertension 10/08/2012   Fibroid 11/13/2011   Abdominal pain 11/13/2011   Pelvic pain 11/13/2011   Polyp of colon 08/05/2010   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 [place] [address] Phone: [phone] Fax: [phone]     Social Drivers of Health (SDOH) Social History: SDOH Screenings   Food Insecurity: No Food Insecurity (07/11/2024)  Housing: Low Risk  (07/11/2024)  Transportation Needs: No Transportation Needs (07/11/2024)  Utilities: Not At Risk  (07/11/2024)  Depression (PHQ2-9): Low Risk  (03/19/2022)  Tobacco Use: Low Risk  (07/11/2024)   SDOH Interventions:     Readmission Risk Interventions     No data to display

## 2024-07-12 NOTE — Progress Notes (Addendum)
 OT Cancellation Note  Patient Details Name: Whitney Foster MRN: [id number] DOB: Jun 27, 1961   Cancelled Treatment:    Reason Eval/Treat Not Completed: Other (comment) Per surgeon, hold therapy attempts until Xray results available.  Addendum: Holding until hip abduction brace delivered.  Mliss Fish 07/12/2024, 10:15 AM

## 2024-07-12 NOTE — Evaluation (Signed)
 Physical Therapy Evaluation Patient Details Name: Whitney Foster MRN: [id number] DOB: [birth year] Today's Date: 07/12/2024  History of Present Illness  63 yo female admitted after fall at home in bathroom resulting in R hip pain. Xrays revealed R hip dislocation, unsuccessful reduction in ED. Ortho consulted and pt taken to OR for R  closed hip reduction on 07/11/24. PMH: back pain, HTN, obesity, sleep apnea, CTR, sleep apnea, R THA-anterior approach on 06/04/24  Clinical Impression  Pt admitted with above diagnosis.  Pt is independent at her baseline/prior to THA in October.  Pt requiring min assist for bed mobility, transfers, gait x 20' and cues throughout session to maintain THP. Handouts provided. Pt would benefit from HHPT at d/c   Pt currently with functional limitations due to the deficits listed below (see PT Problem List). Pt will benefit from acute skilled PT to increase their independence and safety with mobility to allow discharge.           If plan is discharge home, recommend the following: A little help with walking and/or transfers;A little help with bathing/dressing/bathroom;Assist for transportation;Help with stairs or ramp for entrance   Can travel by private vehicle        Equipment Recommendations None recommended by PT  Recommendations for Other Services       Functional Status Assessment Patient has had a recent decline in their functional status and demonstrates the ability to make significant improvements in function in a reasonable and predictable amount of time.     Precautions / Restrictions Precautions Precautions: Fall;Posterior Hip Precaution/Restrictions Comments: KI until hip abduction brace Required Braces or Orthoses: Knee Immobilizer - Right      Mobility  Bed Mobility Overal bed mobility: Needs Assistance Bed Mobility: Supine to Sit     Supine to sit: Min assist     General bed mobility comments: cues for sequence, posterior THP.  assist  with RLE    Transfers Overall transfer level: Needs assistance Equipment used: Rolling walker (2 wheels) Transfers: Sit to/from Stand             General transfer comment: cues for hand placement, RLE position/THP    Ambulation/Gait Ambulation/Gait assistance: Contact guard assist Gait Distance (Feet): 20 Feet Assistive device: Rolling walker (2 wheels) Gait Pattern/deviations: Step-to pattern       General Gait Details: cues for hand placement and THP with turns  Stairs            Wheelchair Mobility     Tilt Bed    Modified Rankin (Stroke Patients Only)       Balance Overall balance assessment: Needs assistance Sitting-balance support: Feet supported, No upper extremity supported Sitting balance-Leahy Scale: Fair Sitting balance - Comments: not challenged   Standing balance support: Reliant on assistive device for balance, During functional activity Standing balance-Leahy Scale: Poor                               Pertinent Vitals/Pain Pain Assessment Pain Assessment: 0-10 Pain Location: right hip Pain Descriptors / Indicators: Sore Pain Intervention(s): Limited activity within patient's tolerance, Monitored during session, Premedicated before session, Repositioned    Home Living Family/patient expects to be discharged to:: Private residence Living Arrangements: Spouse/significant other Available Help at Discharge: Family Type of Home: House Home Access: Stairs to enter   [employer] of Steps: 2 steps in and 1 step down on inside   Home Layout: One level  Home Equipment: [employer] (2 wheels)      Prior Function Prior Level of Function : Independent/Modified Independent             Mobility Comments: independent prior to THA       Extremity/Trunk Assessment   Upper Extremity Assessment Upper Extremity Assessment: Defer to OT evaluation;Overall [facility] for tasks assessed    Lower Extremity  Assessment Lower Extremity Assessment: RLE deficits/detail RLE Deficits / Details: ankle WFL RLE: Unable to fully assess due to immobilization       Communication        Cognition Arousal: Alert Behavior During Therapy: WFL for tasks assessed/performed   PT - Cognitive impairments: No apparent impairments                         Following commands: Intact       Cueing Cueing Techniques: Verbal cues     General Comments      Exercises General Exercises - Lower Extremity Ankle Circles/Pumps: AROM, Both, 5 reps   Assessment/Plan    PT Assessment Patient needs continued PT services  PT Problem List Decreased strength;Decreased activity tolerance;Decreased balance;Decreased knowledge of use of DME;Pain;Decreased mobility       PT Treatment Interventions DME instruction;Therapeutic exercise;Gait training;Functional mobility training;Therapeutic activities;Patient/family education;Stair training    PT Goals (Current goals can be found in the Care Plan section)  Acute Rehab PT Goals PT Goal Formulation: With patient Time For Goal Achievement: 07/19/24 Potential to Achieve Goals: Good    Frequency 7X/week     Co-evaluation               AM-PAC PT 6 Clicks Mobility  Outcome Measure Help needed turning from your back to your side while in a flat bed without using bedrails?: A Little Help needed moving from lying on your back to sitting on the side of a flat bed without using bedrails?: A Little Help needed moving to and from a bed to a chair (including a wheelchair)?: A Little Help needed standing up from a chair using your arms (e.g., wheelchair or bedside chair)?: A Little Help needed to walk in hospital room?: A Little Help needed climbing 3-5 steps with a railing? : A Little 6 Click Score: 18    End of Session Equipment Utilized During Treatment: Gait belt;Right knee immobilizer Activity Tolerance: Patient tolerated treatment well Patient  left: in chair;with call bell/phone within reach;with chair alarm set Nurse Communication: Mobility status PT Visit Diagnosis: Other abnormalities of gait and mobility (R26.89);Difficulty in walking, not elsewhere classified (R26.2)    Time: 8563-8483 PT Time Calculation (min) (ACUTE ONLY): 40 min   Charges:   PT Evaluation $PT Eval Low Complexity: 1 Low PT Treatments $Gait Training: 8-22 mins $Therapeutic Activity: 8-22 mins PT General Charges $$ ACUTE PT VISIT: 1 Visit         Cutler Sunday, PT  Acute Rehab Dept [facility]) [phone]  07/12/2024   [facility] 07/12/2024, 3:27 PM

## 2024-07-12 NOTE — Progress Notes (Signed)
 Chaplains received a consult to provide Whitney Foster with information advance directives.  She and her husband are in the process of filling out durable poa paperwork and she was not sure if HCPOA was included.  I brought her the paperwork to look over.  I also provided emotional and spiritual support through listening and encouragement.

## 2024-07-12 NOTE — Progress Notes (Signed)
    Subjective:  Patient reports pain as mild.  Denies N/V/CP/SOB/Abd pain. She reports she is doing well this morning.  Discussed posterior hip precautions and hip abduction brace today.  Orders placed.   Objective:   VITALS:   Vitals:   07/11/24 2031 07/11/24 2307 07/12/24 0317 07/12/24 0557  BP: (!) 141/77 137/71 112/71 128/77  Pulse: 92 (!) 102 97 95  Resp: 18 15 18 18   Temp: 98.2 F (36.8 C) 98.3 F (36.8 C) 98.4 F (36.9 C) 99.2 F (37.3 C)  TempSrc: Oral   Oral  SpO2: 91% 99% 95% 97%  Weight:      Height:        NAD Neurologically intact ABD soft Neurovascular intact Sensation intact distally Intact pulses distally Dorsiflexion/Plantar flexion intact No cellulitis present Compartment soft Incision healed.  KI in place.  Some thigh swelling noted.   Lab Results  Component Value Date   WBC 7.2 10/09/2012   HGB 9.2 (L) 07/11/2024   HCT 27.0 (L) 07/11/2024   MCV 67.3 (L) 10/09/2012   PLT 334 10/09/2012   BMET    Component Value Date/Time   NA 140 07/11/2024 1515   NA 144 08/26/2022 1126   K 3.7 07/11/2024 1515   CL 104 07/11/2024 1515   CO2 26 08/26/2022 1126   GLUCOSE 103 (H) 07/11/2024 1515   BUN 11 07/11/2024 1515   BUN 11 08/26/2022 1126   CREATININE 0.80 07/11/2024 1515   CALCIUM 9.5 08/26/2022 1126   EGFR 69.0 01/07/2023 1641   EGFR 74 08/26/2022 1126   GFRNONAA >90 10/08/2012 1335     Assessment/Plan: 1 Day Post-Op   Principal Problem:   Degenerative joint disease (DJD) of hip   WBAT with walker, posterior hip precautions, hip abduction brace ordered. KI until hip abduction brace.  DVT ppx:, aspirin , SCDs, TEDS PO pain control PT/OT: To come today.  Dispo:  - D/c home once cleared with PT and brace.    Valery GORMAN Potters 07/12/2024, 9:06 AM   [facility]  [place] [address], [address], [place], [place] [address] Phone: [phone] [email or web address] Facebook  [employer]

## 2024-07-12 NOTE — Discharge Instructions (Signed)
 Dr. Redell Shoals Joint Replacement Specialist [facility] [address] [place], [place] [address] [phone]   TOTAL HIP REPLACEMENT POSTOPERATIVE DIRECTIONS    Hip Rehabilitation, Guidelines Following Surgery   WEIGHT BEARING Weightbearing as tolerated with walker and hip abduction brace.  Maintain posterior hip precautions.   The results of a hip operation are greatly improved after range of motion and muscle strengthening exercises. Follow all safety measures which are given to protect your hip. If any of these exercises cause increased pain or swelling in your joint, decrease the amount until you are comfortable again. Then slowly increase the exercises. Call your caregiver if you have problems or questions.   HOME CARE INSTRUCTIONS  Most of the following instructions are designed to prevent the dislocation of your new hip.  Remove items at home which could result in a fall. This includes throw rugs or furniture in walking pathways.  Continue medications as instructed at time of discharge. You may have some home medications which will be placed on hold until you complete the course of blood thinner medication. You may start showering once you are discharged home. Do not put on socks or shoes without following the instructions of your caregivers.   Sit on chairs with arms. Use the chair arms to help push yourself up when arising.  Arrange for the use of a toilet seat elevator so you are not sitting low.  Walk with walker as instructed.  You may resume a sexual relationship in one month or when given the OK by your caregiver.  Use walker as long as suggested by your caregivers.  You may put full weight on your legs and walk as much as is comfortable. Avoid periods of inactivity such as sitting longer than an hour when not asleep. This helps prevent blood clots.  You may return to work once you are cleared by designer, industrial/product.  Do not drive a car for 6  weeks or until released by your surgeon.  Do not drive while taking narcotics.  Wear elastic stockings for two weeks following surgery during the day but you may remove then at night.  Make sure you keep all of your appointments after your operation with all of your doctors and caregivers. You should call the office at the above phone number and make an appointment for approximately two weeks after the date of your surgery. Please pick up a stool softener and laxative for home use as long as you are requiring pain medications. ICE to the affected hip every three hours for 30 minutes at a time and then as needed for pain and swelling. Continue to use ice on the hip for pain and swelling. You may notice swelling that will progress down to the foot and ankle.  This is normal after surgery.  Elevate the leg when you are not up walking on it.   It is important for you to complete the blood thinner medication as prescribed by your doctor. Continue to use the breathing machine which will help keep your temperature down.  It is common for your temperature to cycle up and down following surgery, especially at night when you are not up moving around and exerting yourself.  The breathing machine keeps your lungs expanded and your temperature down.  RANGE OF MOTION AND STRENGTHENING EXERCISES  These exercises are designed to help you keep full movement of your hip joint. Follow your caregiver's or physical therapist's instructions. Perform all exercises about fifteen times,  three times per day or as directed. Exercise both hips, even if you have had only one joint replacement. These exercises can be done on a training (exercise) mat, on the floor, on a table or on a bed. Use whatever works the best and is most comfortable for you. Use music or television while you are exercising so that the exercises are a pleasant break in your day. This will make your life better with the exercises acting as a break in routine you  can look forward to.  Lying on your back, slowly slide your foot toward your buttocks, raising your knee up off the floor. Then slowly slide your foot back down until your leg is straight again.  Lying on your back spread your legs as far apart as you can without causing discomfort.  Lying on your side, raise your upper leg and foot straight up from the floor as far as is comfortable. Slowly lower the leg and repeat.  Lying on your back, tighten up the muscle in the front of your thigh (quadriceps muscles). You can do this by keeping your leg straight and trying to raise your heel off the floor. This helps strengthen the largest muscle supporting your knee.  Lying on your back, tighten up the muscles of your buttocks both with the legs straight and with the knee bent at a comfortable angle while keeping your heel on the floor.     POST-OPERATIVE OPIOID TAPER INSTRUCTIONS: It is important to wean off of your opioid medication as soon as possible. If you do not need pain medication after your surgery it is ok to stop day one. Opioids include: Codeine, Hydrocodone(Norco, Vicodin), Oxycodone (Percocet, oxycontin ) and hydromorphone  amongst others.  Long term and even short term use of opiods can cause: Increased pain response Dependence Constipation Depression Respiratory depression And more.  Withdrawal symptoms can include Flu like symptoms Nausea, vomiting And more Techniques to manage these symptoms Hydrate well Eat regular healthy meals Stay active Use relaxation techniques(deep breathing, meditating, yoga) Do Not substitute Alcohol to help with tapering If you have been on opioids for less than two weeks and do not have pain than it is ok to stop all together.  Plan to wean off of opioids This plan should start within one week post op of your joint replacement. Maintain the same interval or time between taking each dose and first decrease the dose.  Cut the total daily intake of  opioids by one tablet each day Next start to increase the time between doses. The last dose that should be eliminated is the evening dose.    MAKE SURE YOU:  Understand these instructions.  Will watch your condition.  Will get help right away if you are not doing well or get worse.   Continue to use ice for pain and swelling.  Please wear hip abduction brace.  Follow posterior hip precautions.  Keep scheduled follow-up with Dr. Fidel 07/23/24.

## 2024-07-12 NOTE — Progress Notes (Signed)
 Orthopedic Tech Progress Note Patient Details:  Whitney Foster [birth year] [id number]  Patient ID: Whitney Foster, female   DOB: 1961-04-21, 63 y.o.   MRN: [id number] Called in [employer] for hip abduction brace.  Morna Pink 07/12/2024, 9:29 AM

## 2024-07-12 NOTE — Progress Notes (Signed)
 PT Cancellation Note  Patient Details Name: Whitney Foster MRN: [id number] DOB: 06/28/1961   Cancelled Treatment:    Reason Eval/Treat Not Completed: Medical issues which prohibited therapy; Hold PT until xray results available per Dr Duwayne   [facility] 07/12/2024, 8:25 AM

## 2024-07-13 NOTE — Progress Notes (Signed)
 Sent message, via epic in basket, requesting orders in epic from Careers adviser.

## 2024-07-13 NOTE — Anesthesia Postprocedure Evaluation (Signed)
 Anesthesia Post Note  Patient: Whitney Foster  Procedure(s) Performed: CLOSED REDUCTION, HIP (Right: Hip)     Patient location during evaluation: PACU Anesthesia Type: General Level of consciousness: awake and alert Pain management: pain level controlled Vital Signs Assessment: post-procedure vital signs reviewed and stable Respiratory status: spontaneous breathing, nonlabored ventilation, respiratory function stable and patient connected to nasal cannula oxygen Cardiovascular status: blood pressure returned to baseline and stable Postop Assessment: no apparent nausea or vomiting Anesthetic complications: no   No notable events documented.  Last Vitals:  Vitals:   07/13/24 0526 07/13/24 0814  BP: 125/73 116/73  Pulse: 87 82  Resp: 18   Temp: 37 C 36.4 C  SpO2: 100% 100%    Last Pain:  Vitals:   07/13/24 1445  TempSrc:   PainSc: 3                  Catarino Vold S

## 2024-07-13 NOTE — Progress Notes (Signed)
 Physical Therapy Treatment Patient Details Name: Whitney Foster MRN: [id number] DOB: 1961/01/03 Today's Date: 07/13/2024   History of Present Illness 63 yo female admitted after fall at home in bathroom resulting in R hip pain. Xrays revealed R hip dislocation, unsuccessful reduction in ED. Ortho consulted and pt taken to OR for R  closed hip reduction on 07/11/24. PMH: back pain, HTN, obesity, sleep apnea, CTR, sleep apnea, R THA-anterior approach on 06/04/24    PT Comments  Pt is progressing well. Reviewed mobility as below, reinforced posterior THP during functional tasks. Pt would benefit from HHPT for prevention of further potential complications/repeat dislocation and to decr risk of falls.    If plan is discharge home, recommend the following: A little help with walking and/or transfers;A little help with bathing/dressing/bathroom;Assist for transportation;Help with stairs or ramp for entrance   Can travel by private vehicle        Equipment Recommendations  None recommended by PT    Recommendations for Other Services       Precautions / Restrictions Precautions Precautions: Fall;Posterior Hip Recall of Precautions/Restrictions: Intact Precaution/Restrictions Comments: hip abduction brace but also has KI until brace was delivered Required Braces or Orthoses: Other Brace Other Brace: R hip abd brace Restrictions Weight Bearing Restrictions Per Provider Order: No RLE Weight Bearing Per Provider Order: Weight bearing as tolerated     Mobility  Bed Mobility               General bed mobility comments: pt in recliner--patient educated on positioning in alignment in recliner    Transfers Overall transfer level: Needs assistance Equipment used: Rolling walker (2 wheels) Transfers: Sit to/from Stand Sit to Stand: Supervision           General transfer comment: reinforced posterior hip precautions and brace use with RW to ensure adherence; use of gait belt for  caregiver safety    Ambulation/Gait Ambulation/Gait assistance: Supervision, Contact guard assist Gait Distance (Feet): 50 Feet Assistive device: Rolling walker (2 wheels) Gait Pattern/deviations: Step-to pattern Gait velocity: decr     General Gait Details: cues for RW position and sequence, reinforced THP with turns--avoiding internal rotation L hip   Stairs Stairs: Yes Stairs assistance: Min assist, Contact guard assist Stair Management: No rails, Step to pattern, Forwards, With walker Number of Stairs: 5 General stair comments: cues for sequence and technique. min assist to manage RW. last step up/down to simulate single step down in home. good stability, no LOB. pt verbalizes correct technique after review   Wheelchair Mobility     Tilt Bed    Modified Rankin (Stroke Patients Only)       Balance   Sitting-balance support: Feet supported, No upper extremity supported Sitting balance-Leahy Scale: Fair Sitting balance - Comments: not challenged   Standing balance support: Reliant on assistive device for balance, During functional activity Standing balance-Leahy Scale: Poor                              Communication Communication Communication: No apparent difficulties  Cognition Arousal: Alert Behavior During Therapy: WFL for tasks assessed/performed   PT - Cognitive impairments: No apparent impairments                         Following commands: Intact      Cueing Cueing Techniques: Verbal cues  Exercises      General Comments General comments (skin integrity,  edema, etc.): pt has exercise program at home from ortho MD office. defer changes in HEP to ortho MD; pt asking about wearing lift in shoe on R d/t significant leg length discrepancy. per imaging RLE is 4 cm longer than R. reviewed use of lift so pt can wear 2 of the same shoes, trial if for brief period if incr pain or instability noted remove lift. defer permanent use of lift  to MD. pt has been wearing 2 different shoes to compensate for discrepancy      Pertinent Vitals/Pain Pain Assessment Pain Assessment: 0-10 Pain Score: 7  Pain Location: R hip and L knee/hip Pain Descriptors / Indicators: Aching, Sore Pain Intervention(s): Limited activity within patient's tolerance, Monitored during session, Premedicated before session, Repositioned    Home Living Family/patient expects to be discharged to:: Private residence Living Arrangements: Spouse/significant other Available Help at Discharge: Family Type of Home: House Home Access: Stairs to enter   [employer] of Steps: 2 steps in and 1 step down on inside   Home Layout: One level Home Equipment: [employer] (2 wheels)      Prior Function            PT Goals (current goals can now be found in the care plan section) Acute Rehab PT Goals PT Goal Formulation: With patient Time For Goal Achievement: 07/19/24 Potential to Achieve Goals: Good Progress towards PT goals: Progressing toward goals    Frequency    7X/week      PT Plan      Co-evaluation              AM-PAC PT 6 Clicks Mobility   Outcome Measure  Help needed turning from your back to your side while in a flat bed without using bedrails?: A Little Help needed moving from lying on your back to sitting on the side of a flat bed without using bedrails?: A Little Help needed moving to and from a bed to a chair (including a wheelchair)?: A Little Help needed standing up from a chair using your arms (e.g., wheelchair or bedside chair)?: A Little Help needed to walk in hospital room?: A Little Help needed climbing 3-5 steps with a railing? : A Little 6 Click Score: 18    End of Session Equipment Utilized During Treatment: Gait belt;Other (comment) (R hip abd brace) Activity Tolerance: Patient tolerated treatment well Patient left: in chair;with call bell/phone within reach;with chair alarm set Nurse  Communication: Mobility status PT Visit Diagnosis: Other abnormalities of gait and mobility (R26.89);Difficulty in walking, not elsewhere classified (R26.2)     Time: 1500-1530 PT Time Calculation (min) (ACUTE ONLY): 30 min  Charges:    $Gait Training: 23-37 mins PT General Charges $$ ACUTE PT VISIT: 1 Visit                     Gurpreet Mariani, PT  Acute Rehab Dept ([facility]) [phone]  07/13/2024    [facility] 07/13/2024, 4:03 PM

## 2024-07-13 NOTE — TOC Progression Note (Signed)
 Transition of Care [facility]) - Progression Note    Patient Details  Name: Whitney Foster MRN: [id number] Date of Birth: 05-21-1961  [facility] CM/SW Contact  Alfonse JONELLE Rex, RN Phone Number: 07/13/2024, 3:23 PM  Clinical Narrative:   Per Ortho note, plan dc home with HEP, HH PT cancelled.                       Expected Discharge Plan and Services         Expected Discharge Date: 07/13/24                                     Social Drivers of Health (SDOH) Interventions SDOH Screenings   Food Insecurity: No Food Insecurity (07/11/2024)  Housing: Low Risk  (07/11/2024)  Transportation Needs: No Transportation Needs (07/11/2024)  Utilities: Not At Risk (07/11/2024)  Depression (PHQ2-9): Low Risk  (03/19/2022)  Tobacco Use: Low Risk  (07/11/2024)    Readmission Risk Interventions     No data to display

## 2024-07-13 NOTE — Plan of Care (Signed)
  Problem: Safety: Goal: Ability to remain free from injury will improve Outcome: Progressing   Problem: Pain Managment: Goal: General experience of comfort will improve and/or be controlled Outcome: Progressing   Problem: Activity: Goal: Risk for activity intolerance will decrease Outcome: Progressing

## 2024-07-13 NOTE — Evaluation (Signed)
 Occupational Therapy Evaluation Patient Details Name: Whitney Foster MRN: [id number] DOB: [birth year] Today's Date: 07/13/2024   History of Present Illness   63 yo female admitted after fall at home in bathroom resulting in R hip pain. Xrays revealed R hip dislocation, unsuccessful reduction in ED. Ortho consulted and pt taken to OR for R  closed hip reduction on 07/11/24. PMH: back pain, HTN, obesity, sleep apnea, CTR, sleep apnea, R THA-anterior approach on 06/04/24     Clinical Impressions PTA, patient lives at home with husband and prior to THA patient was indep with A/IADL's and mobility including amb and driving. Currently, patient presents with deficits outlined below (see OT Problem List for details) most significantly pain, posterior THA precautions, decreased balance, activity tolerance and functional reach limiting BADL's and functional mobility performance. OT provided written handouts for maintaining posterior THA precautions with ADL's AE/DME as well as ECT's; issued long handled ADL devices and instructed in hip abduction brace management.   Patient requires continued Acute care hospital level OT services to progress safety and functional performance and allow for discharge.       If plan is discharge home, recommend the following:   A little help with walking and/or transfers;A little help with bathing/dressing/bathroom;Assistance with cooking/housework;Assist for transportation;Help with stairs or ramp for entrance     Functional Status Assessment   Patient has had a recent decline in their functional status and demonstrates the ability to make significant improvements in function in a reasonable and predictable amount of time.     Equipment Recommendations   Tub/shower seat;Other (comment) (provided written handout for obtaining shower seat and issued AE for long handled needs)      Precautions/Restrictions   Precautions Precautions: Fall;Posterior  Hip Precaution/Restrictions Comments: hip abduction brace but also has KI until brace was delivered Required Braces or Orthoses: Other Brace (R hip abduction brace) Restrictions Weight Bearing Restrictions Per Provider Order: No RLE Weight Bearing Per Provider Order: Weight bearing as tolerated     Mobility Bed Mobility Overal bed mobility:  (was up in recliner and remained post OT session)             General bed mobility comments: patient educated on positioning in alignment in recliner    Transfers Overall transfer level: Needs assistance Equipment used: Rolling walker (2 wheels) Transfers: Sit to/from Stand, Bed to chair/wheelchair/BSC Sit to Stand: Supervision     Step pivot transfers: Supervision     General transfer comment: reinforced posterior hip precautions and brace use with RW to ensure adherance; use of gait belt for caregiver safety      Balance Overall balance assessment: Needs assistance Sitting-balance support: Feet supported, No upper extremity supported Sitting balance-Leahy Scale: Fair     Standing balance support: Reliant on assistive device for balance, During functional activity Standing balance-Leahy Scale: Poor                             ADL either performed or assessed with clinical judgement   ADL Overall ADL's : Needs assistance/impaired Eating/Feeding: Independent   Grooming: Wash/dry hands;Wash/dry face;Oral care;Applying deodorant;Modified independent;Sitting Grooming Details (indicate cue type and reason): educated on sinkside standing positioning as well Upper Body Bathing: Modified independent;Sitting   Lower Body Bathing: Contact guard assist;Sit to/from stand Lower Body Bathing Details (indicate cue type and reason): issued and trained in use of LH sponge Upper Body Dressing : Modified independent;Sitting   Lower Body Dressing: Minimal  assistance;Sit to/from stand;Adhering to hip precautions Lower Body Dressing  Details (indicate cue type and reason): issued and trained in [facility] shoe horn and sock aide use, has reacher at home; brace management training with cell phone photo of front and back as reference Toilet Transfer: Contact guard assist;BSC/3in1;Rolling walker (2 wheels) Toilet Transfer Details (indicate cue type and reason): in bathroom with commode over top Toileting- Clothing Manipulation and Hygiene: Set up;Sit to/from stand Toileting - Clothing Manipulation Details (indicate cue type and reason): educated on toilet aide/tongs and Easy care bidet if needed for posterior reach   Tub/Shower Transfer Details (indicate cue type and reason): issued education handout for shower chair and patient to purchase online Functional mobility during ADLs: Contact guard assist;Rolling walker (2 wheels) General ADL Comments: emphasis throughout session on hip precautions and alignment     Vision Baseline Vision/History: 0 No visual deficits;1 Wears glasses              Pertinent Vitals/Pain Pain Assessment Pain Assessment: 0-10 Pain Score: 8  Pain Location: B hips Pain Descriptors / Indicators: Aching, Sore Pain Intervention(s): Limited activity within patient's tolerance, Monitored during session, Repositioned, Patient requesting pain meds-RN notified, Ice applied     Extremity/Trunk Assessment Upper Extremity Assessment Upper Extremity Assessment: Overall WFL for tasks assessed;Right hand dominant   Lower Extremity Assessment Lower Extremity Assessment: Defer to PT evaluation       Communication Communication Communication: No apparent difficulties   Cognition Arousal: Alert Behavior During Therapy: WFL for tasks assessed/performed Cognition: No apparent impairments                               Following commands: Intact       Cueing  General Comments   Cueing Techniques: Verbal cues  new hip adbduction brace in place, educated in use, AD and DME recs, no SOB nor  edema noted, TED hose in place for day use           Home Living Family/patient expects to be discharged to:: Private residence Living Arrangements: Spouse/significant other Available Help at Discharge: Family Type of Home: House Home Access: Stairs to enter [employer] of Steps: 2 steps in and 1 step down on inside   Home Layout: One level     Bathroom Shower/Tub: Walk-in shower;Door   [employer]: Standard (3 in 1 commode) Bathroom Accessibility: Yes How Accessible: Accessible via walker Home Equipment: Rolling Walker (2 wheels)          Prior Functioning/Environment Prior Level of Function : Independent/Modified Independent             Mobility Comments: independent prior to THA ADLs Comments: indep and retired and driving    OT Problem List: Decreased activity tolerance;Impaired balance (sitting and/or standing);Obesity;Pain   OT Treatment/Interventions: Self-care/ADL training;Therapeutic exercise;Energy conservation;DME and/or AE instruction;Therapeutic activities;Patient/family education;Balance training      OT Goals(Current goals can be found in the care plan section)   Acute Rehab OT Goals Patient Stated Goal: to go home safely OT Goal Formulation: With patient Time For Goal Achievement: 07/27/24 Potential to Achieve Goals: Good   OT Frequency:  Min 2X/week       AM-PAC OT 6 Clicks Daily Activity     Outcome Measure Help from another person eating meals?: None Help from another person taking care of personal grooming?: None Help from another person toileting, which includes using toliet, bedpan, or urinal?: A Little Help from  another person bathing (including washing, rinsing, drying)?: A Little Help from another person to put on and taking off regular upper body clothing?: None Help from another person to put on and taking off regular lower body clothing?: A Little 6 Click Score: 21   End of Session Equipment Utilized During  Treatment: Gait belt;Rolling walker (2 wheels);Other (comment) (hip abd brace) Nurse Communication: Mobility status;Patient requests pain meds;Weight bearing status  Activity Tolerance: Patient limited by pain Patient left: in chair;with call bell/phone within reach;with chair alarm set  OT Visit Diagnosis: Unsteadiness on feet (R26.81);Pain Pain - Right/Left: Right (Left) Pain - part of body: Hip                Time: 1345-1430 OT Time Calculation (min): 45 min Charges:  OT General Charges $OT Visit: 1 Visit OT Evaluation $OT Eval Low Complexity: 1 Low OT Treatments $Self Care/Home Management : 8-22 mins  Abel Ra OT/L Acute Rehabilitation Department  [phone]  07/13/2024, 2:48 PM

## 2024-07-13 NOTE — Progress Notes (Signed)
 Discharge instructions given to patient questions asked and answered.

## 2024-07-13 NOTE — Progress Notes (Addendum)
    Subjective:  Patient reports pain as none. Denies N/V/CP/SOB. She denies any tingling or numbness in LE bilaterally.  She was measured for hip abduction brace yesterday, but has still not received it.  She was nervous about posterior hip precautions and limitations yesterday with PT. Addressed all concerns. Only limitation is wearing the brace and posterior hip precautions.  She is eager for d/c home with HEP this date.  She reports she has not really been out of bed since admission except with PT yesterday.  Purewick removed, do not replace.  Discussed more than okay to ambulate to the bathroom with walker, but staff just has to be in the room for safety.   Objective:   VITALS:   Vitals:   07/12/24 0907 07/12/24 2207 07/13/24 0526 07/13/24 0814  BP: 129/79 107/63 125/73 116/73  Pulse: 90 (!) 103 87 82  Resp: 16 18 18    Temp: 98.2 F (36.8 C) 98.2 F (36.8 C) 98.6 F (37 C) 97.6 F (36.4 C)  TempSrc: Oral Oral Oral Oral  SpO2: 98% 100% 100% 100%  Weight:      Height:        NAD Neurologically intact ABD soft Neurovascular intact Sensation intact distally Intact pulses distally Dorsiflexion/Plantar flexion intact No cellulitis present Compartment soft Incision well healed.    Lab Results  Component Value Date   WBC 7.2 10/09/2012   HGB 9.2 (L) 07/11/2024   HCT 27.0 (L) 07/11/2024   MCV 67.3 (L) 10/09/2012   PLT 334 10/09/2012   BMET    Component Value Date/Time   NA 140 07/11/2024 1515   NA 144 08/26/2022 1126   K 3.7 07/11/2024 1515   CL 104 07/11/2024 1515   CO2 26 08/26/2022 1126   GLUCOSE 103 (H) 07/11/2024 1515   BUN 11 07/11/2024 1515   BUN 11 08/26/2022 1126   CREATININE 0.80 07/11/2024 1515   CALCIUM 9.5 08/26/2022 1126   EGFR 69.0 01/07/2023 1641   EGFR 74 08/26/2022 1126   GFRNONAA >90 10/08/2012 1335     Assessment/Plan: 2 Days Post-Op   Principal Problem:   Degenerative joint disease (DJD) of hip   WBAT with walker hip  abduction brace, posterior hip precautions.  DVT ppx: Aspirin , SCDs, TEDS PO pain control PT/OT: To come today she ambulated well yesterday. Mainly looking for brace and that she understands posterior hip precautions.  Dispo:  -d/c home with HEP once she has received brace.    Valery GORMAN Potters 07/13/2024, 9:34 AM   [facility]  [place] [address], [address], [place], [place] [address] Phone: [phone] [email or web address] Facebook  [employer]

## 2024-07-19 NOTE — Patient Instructions (Signed)
 SURGICAL WAITING ROOM VISITATION Patients having surgery or a procedure may have no more than 2 support people in the waiting area - these visitors may rotate in the visitor waiting room.   Due to an increase in RSV and influenza rates and associated hospitalizations, children ages 9 and under may not visit patients in [facility] hospitals. If the patient needs to stay at the hospital during part of their recovery, the visitor guidelines for inpatient rooms apply.  PRE-OP VISITATION  Pre-op nurse will coordinate an appropriate time for 1 support person to accompany the patient in pre-op.  This support person may not rotate.  This visitor will be contacted when the time is appropriate for the visitor to come back in the pre-op area.  Please refer to the [facility] website for the visitor guidelines for Inpatients (after your surgery is over and you are in a regular room).  You are not required to quarantine at this time prior to your surgery. However, you must do this: Hand Hygiene often Do NOT share personal items Notify your provider if you are in close contact with someone who has COVID or you develop fever 100.4 or greater, new onset of sneezing, cough, sore throat, shortness of breath or body aches.  If you test positive for Covid or have been in contact with anyone that has tested positive in the last 10 days please notify you surgeon.    Your procedure is scheduled on:  07/28/24  Report to [facility] Main Entrance: [place] entrance where the [employer] is available.   Report to admitting at: 7:55 AM  Call this number if you have any questions or problems the morning of surgery [phone]  FOLLOW ANY ADDITIONAL PRE OP INSTRUCTIONS YOU RECEIVED FROM YOUR SURGEON'S OFFICE!!!  Do not eat food after Midnight the night prior to your surgery/procedure.  After Midnight you may have the following liquids until: 7:25 AM DAY OF SURGERY  Clear Liquid Diet Water Black  Coffee (sugar ok, NO MILK/CREAM OR CREAMERS)  Tea (sugar ok, NO MILK/CREAM OR CREAMERS) regular and decaf                             Plain Jell-O  with no fruit (NO RED)                                           Fruit ices (not with fruit pulp, NO RED)                                     Popsicles (NO RED)                                                                  Juice: NO CITRUS JUICES: only apple, WHITE grape, WHITE cranberry Sports drinks like Gatorade or Powerade (NO RED)   The day of surgery:  Drink ONE (1) Pre-Surgery Clear G2 at : 7:25 AM the morning of surgery. Drink in one sitting. Do not sip.  This drink was given  to you during your hospital pre-op appointment visit. Nothing else to drink after completing the Pre-Surgery Clear Ensure or G2 : No candy, chewing gum or throat lozenges.    Oral Hygiene is also important to reduce your risk of infection.        Remember - BRUSH YOUR TEETH THE MORNING OF SURGERY WITH YOUR REGULAR TOOTHPASTE  Do NOT smoke after Midnight the night before surgery.  STOP TAKING all Vitamins, Herbs and supplements 1 week before your surgery.   Take ONLY these medicines the morning of surgery with A SIP OF WATER: NONE. Tylenol ,cetirizine as needed.  If You have been diagnosed with Sleep Apnea - Bring CPAP mask and tubing day of surgery. We will provide you with a CPAP machine on the day of your surgery.                   You may not have any metal on your body including hair pins, jewelry, and body piercing  Do not wear make-up, lotions, powders, perfumes / cologne, or deodorant  Do not wear nail polish including gel and S&S, artificial / acrylic nails, or any other type of covering on natural nails including finger and toenails. If you have artificial nails, gel coating, etc., that needs to be removed by a nail salon, Please have this removed prior to surgery. Not doing so may mean that your surgery could be cancelled or delayed if the Surgeon or  anesthesia staff feels like they are unable to monitor you safely.   Do not shave 48 hours prior to surgery to avoid nicks in your skin which may contribute to postoperative infections.   Contacts, Hearing Aids, dentures or bridgework may not be worn into surgery. DENTURES WILL BE REMOVED PRIOR TO SURGERY PLEASE DO NOT APPLY Poly grip OR ADHESIVES!!!  You may bring a small overnight bag with you on the day of surgery, only pack items that are not valuable. [facility] IS NOT RESPONSIBLE   FOR VALUABLES THAT ARE LOST OR STOLEN.   Patients discharged on the day of surgery will not be allowed to drive home.  Someone NEEDS to stay with you for the first 24 hours after anesthesia.  Do not bring your home medications to the hospital. The Pharmacy will dispense medications listed on your medication list to you during your admission in the Hospital.  Special Instructions: Bring a copy of your healthcare power of attorney and living will documents the day of surgery, if you wish to have them scanned into your [facility] Records- EPIC  Please read over the following fact sheets you were given: IF YOU HAVE QUESTIONS ABOUT YOUR PRE-OP INSTRUCTIONS, PLEASE CALL [phone]  PATIENT SIGNATURE_________________________________  NURSE SIGNATURE__________________________________  ________________________________________________________________________  Pre-operative 4 CHG Bath Instructions  DYNA-Hex 4 Chlorhexidine Gluconate 4% Solution Antiseptic 4 fl. oz   You can play a key role in reducing the risk of infection after surgery. Your skin needs to be as free of germs as possible. You can reduce the number of germs on your skin by washing with CHG (chlorhexidine gluconate) soap before surgery. CHG is an antiseptic soap that kills germs and continues to kill germs even after washing.   DO NOT use if you have an allergy to chlorhexidine/CHG or antibacterial soaps. If your skin becomes reddened or  irritated, stop using the CHG and notify one of our RNs at   Please shower with the CHG soap starting 4 days before surgery using the following schedule:  Please keep in mind the following:  DO NOT shave, including legs and underarms, starting the day of your first shower.   You may shave your face at any point before/day of surgery.  Place clean sheets on your bed the day you start using CHG soap. Use a clean washcloth (not used since being washed) for each shower. DO NOT sleep with pets once you start using the CHG.  CHG Shower Instructions:  If you choose to wash your hair and private area, wash first with your normal shampoo/soap.  After you use shampoo/soap, rinse your hair and body thoroughly to remove shampoo/soap residue.  Turn the water OFF and apply about 3 tablespoons (45 ml) of CHG soap to a CLEAN washcloth.  Apply CHG soap ONLY FROM YOUR NECK DOWN TO YOUR TOES (washing for 3-5 minutes)  DO NOT use CHG soap on face, private areas, open wounds, or sores.  Pay special attention to the area where your surgery is being performed.  If you are having back surgery, having someone wash your back for you may be helpful. Wait 2 minutes after CHG soap is applied, then you may rinse off the CHG soap.  Pat dry with a clean towel  Put on clean clothes/pajamas   If you choose to wear lotion, please use ONLY the CHG-compatible lotions on the back of this paper.     Additional instructions for the day of surgery: DO NOT APPLY any lotions, deodorants, cologne, or perfumes.   Put on clean/comfortable clothes.  Brush your teeth.  Ask your nurse before applying any prescription medications to the skin.   CHG Compatible Lotions   Aveeno Moisturizing lotion  Cetaphil Moisturizing Cream  Cetaphil Moisturizing Lotion  Clairol Herbal Essence Moisturizing Lotion, Dry Skin  Clairol Herbal Essence Moisturizing Lotion, Extra Dry Skin  Clairol Herbal Essence Moisturizing Lotion, Normal Skin   Curel Age Defying Therapeutic Moisturizing Lotion with Alpha Hydroxy  Curel Extreme Care Body Lotion  Curel Soothing Hands Moisturizing Hand Lotion  Curel Therapeutic Moisturizing Cream, Fragrance-Free  Curel Therapeutic Moisturizing Lotion, Fragrance-Free  Curel Therapeutic Moisturizing Lotion, Original Formula  Eucerin Daily Replenishing Lotion  Eucerin Dry Skin Therapy Plus Alpha Hydroxy Crme  Eucerin Dry Skin Therapy Plus Alpha Hydroxy Lotion  Eucerin Original Crme  Eucerin Original Lotion  Eucerin Plus Crme Eucerin Plus Lotion  Eucerin TriLipid Replenishing Lotion  Keri Anti-Bacterial Hand Lotion  Keri Deep Conditioning Original Lotion Dry Skin Formula Softly Scented  Keri Deep Conditioning Original Lotion, Fragrance Free Sensitive Skin Formula  Keri Lotion Fast Absorbing Fragrance Free Sensitive Skin Formula  Keri Lotion Fast Absorbing Softly Scented Dry Skin Formula  Keri Original Lotion  Keri Skin Renewal Lotion Keri Silky Smooth Lotion  Keri Silky Smooth Sensitive Skin Lotion  Nivea Body Creamy Conditioning Oil  Nivea Body Extra Enriched Lotion  Nivea Body Original Lotion  Nivea Body Sheer Moisturizing Lotion Nivea Crme  Nivea Skin Firming Lotion  NutraDerm 30 Skin Lotion  NutraDerm Skin Lotion  NutraDerm Therapeutic Skin Cream  NutraDerm Therapeutic Skin Lotion  ProShield Protective Hand Cream  Provon moisturizing lotion  Pre-operative 4 CHG Bath Instructions  DYNA-Hex 4 Chlorhexidine Gluconate 4% Solution Antiseptic 4 fl. oz   You can play a key role in reducing the risk of infection after surgery. Your skin needs to be as free of germs as possible. You can reduce the number of germs on your skin by washing with CHG (chlorhexidine gluconate) soap before surgery. CHG is an antiseptic soap that kills  germs and continues to kill germs even after washing.   DO NOT use if you have an allergy to chlorhexidine/CHG or antibacterial soaps. If your skin becomes reddened or  irritated, stop using the CHG and notify one of our RNs at   Please shower with the CHG soap starting 4 days before surgery using the following schedule:     Please keep in mind the following:  DO NOT shave, including legs and underarms, starting the day of your first shower.   You may shave your face at any point before/day of surgery.  Place clean sheets on your bed the day you start using CHG soap. Use a clean washcloth (not used since being washed) for each shower. DO NOT sleep with pets once you start using the CHG.  CHG Shower Instructions:  If you choose to wash your hair and private area, wash first with your normal shampoo/soap.  After you use shampoo/soap, rinse your hair and body thoroughly to remove shampoo/soap residue.  Turn the water OFF and apply about 3 tablespoons (45 ml) of CHG soap to a CLEAN washcloth.  Apply CHG soap ONLY FROM YOUR NECK DOWN TO YOUR TOES (washing for 3-5 minutes)  DO NOT use CHG soap on face, private areas, open wounds, or sores.  Pay special attention to the area where your surgery is being performed.  If you are having back surgery, having someone wash your back for you may be helpful. Wait 2 minutes after CHG soap is applied, then you may rinse off the CHG soap.  Pat dry with a clean towel  Put on clean clothes/pajamas   If you choose to wear lotion, please use ONLY the CHG-compatible lotions on the back of this paper.     Additional instructions for the day of surgery: DO NOT APPLY any lotions, deodorants, cologne, or perfumes.   Put on clean/comfortable clothes.  Brush your teeth.  Ask your nurse before applying any prescription medications to the skin.   CHG Compatible Lotions   Aveeno Moisturizing lotion  Cetaphil Moisturizing Cream  Cetaphil Moisturizing Lotion  Clairol Herbal Essence Moisturizing Lotion, Dry Skin  Clairol Herbal Essence Moisturizing Lotion, Extra Dry Skin  Clairol Herbal Essence Moisturizing Lotion, Normal Skin   Curel Age Defying Therapeutic Moisturizing Lotion with Alpha Hydroxy  Curel Extreme Care Body Lotion  Curel Soothing Hands Moisturizing Hand Lotion  Curel Therapeutic Moisturizing Cream, Fragrance-Free  Curel Therapeutic Moisturizing Lotion, Fragrance-Free  Curel Therapeutic Moisturizing Lotion, Original Formula  Eucerin Daily Replenishing Lotion  Eucerin Dry Skin Therapy Plus Alpha Hydroxy Crme  Eucerin Dry Skin Therapy Plus Alpha Hydroxy Lotion  Eucerin Original Crme  Eucerin Original Lotion  Eucerin Plus Crme Eucerin Plus Lotion  Eucerin TriLipid Replenishing Lotion  Keri Anti-Bacterial Hand Lotion  Keri Deep Conditioning Original Lotion Dry Skin Formula Softly Scented  Keri Deep Conditioning Original Lotion, Fragrance Free Sensitive Skin Formula  Keri Lotion Fast Absorbing Fragrance Free Sensitive Skin Formula  Keri Lotion Fast Absorbing Softly Scented Dry Skin Formula  Keri Original Lotion  Keri Skin Renewal Lotion Keri Silky Smooth Lotion  Keri Silky Smooth Sensitive Skin Lotion  Nivea Body Creamy Conditioning Oil  Nivea Body Extra Enriched Teacher, Adult Education Moisturizing Lotion Nivea Crme  Nivea Skin Firming Lotion  NutraDerm 30 Skin Lotion  NutraDerm Skin Lotion  NutraDerm Therapeutic Skin Cream  NutraDerm Therapeutic Skin Lotion  ProShield Protective Hand Cream  Provon moisturizing lotion

## 2024-07-20 ENCOUNTER — Encounter (HOSPITAL_COMMUNITY)
Admission: RE | Admit: 2024-07-20 | Discharge: 2024-07-20 | Disposition: A | Discharge status: Home / Self Care | Source: Ambulatory Visit | Attending: Orthopedic Surgery | Admitting: Orthopedic Surgery

## 2024-07-20 ENCOUNTER — Encounter (HOSPITAL_COMMUNITY): Payer: Self-pay

## 2024-07-20 ENCOUNTER — Other Ambulatory Visit: Payer: Self-pay

## 2024-07-20 ENCOUNTER — Encounter (HOSPITAL_COMMUNITY): Payer: Self-pay | Admitting: Medical

## 2024-07-20 VITALS — BP 114/68 | HR 108 | Temp 98.3°F | Ht 61.0 in | Wt 200.6 lb

## 2024-07-20 DIAGNOSIS — Z01812 Encounter for preprocedural laboratory examination: Secondary | ICD-10-CM | POA: Diagnosis present

## 2024-07-20 DIAGNOSIS — Z01818 Encounter for other preprocedural examination: Secondary | ICD-10-CM

## 2024-07-20 DIAGNOSIS — R030 Elevated blood-pressure reading, without diagnosis of hypertension: Secondary | ICD-10-CM

## 2024-07-20 LAB — CBC
HCT: 28.9 % — ABNORMAL LOW (ref 36.0–46.0)
Hemoglobin: 8.8 g/dL — ABNORMAL LOW (ref 12.0–15.0)
MCH: 24.7 pg — ABNORMAL LOW (ref 26.0–34.0)
MCHC: 30.4 g/dL (ref 30.0–36.0)
MCV: 81.2 fL (ref 80.0–100.0)
Platelets: 408 K/uL — ABNORMAL HIGH (ref 150–400)
RBC: 3.56 MIL/uL — ABNORMAL LOW (ref 3.87–5.11)
RDW: 14.5 % (ref 11.5–15.5)
WBC: 7 K/uL (ref 4.0–10.5)
nRBC: 0 % (ref 0.0–0.2)

## 2024-07-20 LAB — BASIC METABOLIC PANEL WITH GFR
Anion gap: 9 (ref 5–15)
BUN: 14 mg/dL (ref 8–23)
CO2: 26 mmol/L (ref 22–32)
Calcium: 10.1 mg/dL (ref 8.9–10.3)
Chloride: 105 mmol/L (ref 98–111)
Creatinine, Ser: 0.69 mg/dL (ref 0.44–1.00)
GFR, Estimated: >60 mL/min (ref >60)
Glucose, Bld: 101 mg/dL — ABNORMAL HIGH (ref 70–99)
Potassium: 3.7 mmol/L (ref 3.5–5.1)
Sodium: 140 mmol/L (ref 135–145)

## 2024-07-20 LAB — SURGICAL PCR SCREEN
MRSA, PCR: NEGATIVE
Staphylococcus aureus: POSITIVE — AB

## 2024-07-20 NOTE — Progress Notes (Signed)
 For Anesthesia: PCP - Rexanne Ingle, MD  Cardiologist - Court Dorn PARAS, MD  Clearance: Glendia Ferrier: PA-C: 12/09/23: previous right THA Bowel Prep reminder:  Chest x-ray -  EKG - 12/09/23 Stress Test -  ECHO - 06/23/18 Cardiac Cath -  Pacemaker/ICD device last checked: Pacemaker orders received: Device Rep notified:  Spinal Cord Stimulator:N/A  Sleep Study - Yes CPAP - Yes  Fasting Blood Sugar - N/A Checks Blood Sugar _____ times a day Date and result of last Hgb A1c-  Last dose of GLP1 agonist- N/A GLP1 instructions: Hold 7 days prior to schedule (Hold 24 hours-daily)   Last dose of SGLT-2 inhibitors- N/A SGLT-2 instructions: Hold 72 hours prior to surgery  Blood Thinner Instructions:N/A Last Dose: Time last taken:  Aspirin  Instructions: Last Dose: Time last taken:  Activity level:    Unable to go up a flight of stairs due to recent right hip dislocation and left hip pain.    Anesthesia review: Hx: HTN,Pre-DIA,OSA(CPAP).  Patient denies shortness of breath, fever, cough and chest pain at PAT appointment   Patient verbalized understanding of instructions that were reviewed over the telephone.

## 2024-07-20 NOTE — Progress Notes (Addendum)
 Lab results:Hemoglobin: 8.8 HCT: 28 PCR: + STAPH

## 2024-07-21 ENCOUNTER — Encounter (HOSPITAL_COMMUNITY): Payer: Self-pay

## 2024-07-21 NOTE — Progress Notes (Incomplete)
 Case: [id number] Date/Time: 07/28/24 1011   Procedure: ARTHROPLASTY, HIP, TOTAL, ANTERIOR APPROACH (Left: Hip)   Anesthesia type: Spinal   Diagnosis: Primary osteoarthritis of left hip [M16.12]   Pre-op diagnosis: Left hip osteoarthritis   Location: [facility] ROOM 07 / [facility]   Surgeons: Fidel Rogue, MD       DISCUSSION: Whitney Foster is a 63 yo female with PMH of HTN, OSA (intolerant of CPAP), GERD, arthritis, anemia, PONV.  Patient underwent R THA with Dr. Edna on Oct 31. No anesthesia records to review, possibly done at surgery center.  Patient went to the ED on 12/7 for R hip dislocation. She had to have a closed reduction in the OR. CBC done showed Hgb was 9.2. Patient has chronic anemia. Pre op hgb slightly lower at 8.8 which was routed to Dr. Fidel.   Patient follows with Cardiology for HTN. Echo and stress testing in 2010 was normal. Seen on 12/09/23 for pre op clearance prior to her R hip surgery and cleared as low risk. Advised f/u in 1 year.  Seen by PCP on 05/04/24 at [facility]. All issues stable. Hgb was 12.8 at PCP office.  VS: BP 114/68   Pulse (!) 108   Temp 36.8 C (Oral)   Ht 5' 1 (1.549 m)   Wt 91 kg   LMP 04/14/2013   SpO2 100%   BMI 37.91 kg/m   PROVIDERS: Rexanne Ingle, MD   LABS: {[facility] AN LABS REVIEWED:112001::Labs reviewed: Acceptable for surgery.} (all labs ordered are listed, but only abnormal results are displayed)  Labs Reviewed  SURGICAL PCR SCREEN - Abnormal; Notable for the following components:      Result Value   Staphylococcus aureus POSITIVE (*)    All other components within normal limits  BASIC METABOLIC PANEL WITH GFR - Abnormal; Notable for the following components:   Glucose, Bld 101 (*)    All other components within normal limits  CBC - Abnormal; Notable for the following components:   RBC 3.56 (*)    Hemoglobin 8.8 (*)    HCT 28.9 (*)    MCH 24.7 (*)    Platelets 408 (*)    All other components within normal limits   TYPE AND SCREEN     IMAGES:   EKG:   CV:  Past Medical History:  Diagnosis Date   Anemia    secondary to heavy menses- transfused in the past   Back pain    Complication of anesthesia    Edema of both lower extremities    Fibroids    2000   GERD (gastroesophageal reflux disease)    Hip pain    Hypertension    Joint pain    Obesity    BMI 41   Osteoarthritis 12/23/2023   PONV (postoperative nausea and vomiting)    Pre-diabetes    Sciatic pain    Sleep apnea    Vitamin D  deficiency     Past Surgical History:  Procedure Laterality Date   BREAST EXCISIONAL BIOPSY Left    CARPAL TUNNEL RELEASE N/A    CESAREAN SECTION     1998   HIP CLOSED REDUCTION Right 07/11/2024   Procedure: CLOSED REDUCTION, HIP;  Surgeon: Duwayne Purchase, MD;  Location: [facility];  Service: Orthopedics;  Laterality: Right;   MYOMECTOMY ABDOMINAL APPROACH     1994   MYOMECTOMY VAGINAL APPROACH     1997   vocal nodules      MEDICATIONS:  acetaminophen  (TYLENOL ) 325 MG tablet  aspirin  81 MG chewable tablet   celecoxib (CELEBREX) 100 MG capsule   cetirizine (ZYRTEC) 10 MG tablet   Docusate Sodium  (DSS) 100 MG CAPS   losartan -hydrochlorothiazide  (HYZAAR) 50-12.5 MG per tablet   methocarbamol  (ROBAXIN ) 500 MG tablet   ondansetron  (ZOFRAN ) 4 MG tablet   prednisoLONE acetate (PRED FORTE) 1 % ophthalmic suspension   No current facility-administered medications for this encounter.

## 2024-07-28 ENCOUNTER — Encounter: Admission: RE | Payer: Self-pay

## 2024-07-28 ENCOUNTER — Ambulatory Visit (HOSPITAL_COMMUNITY): Admission: RE | Admit: 2024-07-28 | Admitting: Orthopedic Surgery

## 2024-07-28 LAB — TYPE AND SCREEN
ABO/RH(D): O POS
Antibody Screen: NEGATIVE

## 2024-07-28 SURGERY — ARTHROPLASTY, HIP, TOTAL, ANTERIOR APPROACH
Anesthesia: Spinal | Site: Hip | Laterality: Left
